# Patient Record
Sex: Male | Born: 1960
Health system: Southern US, Community
[De-identification: ages and names within clinical notes are randomized; demographics above are authoritative.]

## PROBLEM LIST (undated history)

## (undated) DIAGNOSIS — E119 Type 2 diabetes mellitus without complications: Secondary | ICD-10-CM

## (undated) HISTORY — DX: Type 2 diabetes mellitus without complications: E11.9

## (undated) HISTORY — PX: SPINE SURGERY: SHX786

---

## 1997-09-18 ENCOUNTER — Emergency Department (HOSPITAL_COMMUNITY): Admission: EM | Admit: 1997-09-18 | Discharge: 1997-09-18 | Payer: Self-pay | Admitting: *Deleted

## 2001-12-10 ENCOUNTER — Emergency Department (HOSPITAL_COMMUNITY): Admission: EM | Admit: 2001-12-10 | Discharge: 2001-12-10 | Payer: Self-pay | Admitting: Emergency Medicine

## 2001-12-10 ENCOUNTER — Encounter: Payer: Self-pay | Admitting: Emergency Medicine

## 2006-01-03 ENCOUNTER — Encounter: Admission: RE | Admit: 2006-01-03 | Discharge: 2006-01-03 | Payer: Self-pay | Admitting: Sports Medicine

## 2006-06-05 ENCOUNTER — Ambulatory Visit (HOSPITAL_BASED_OUTPATIENT_CLINIC_OR_DEPARTMENT_OTHER): Admission: RE | Admit: 2006-06-05 | Discharge: 2006-06-05 | Payer: Self-pay | Admitting: Orthopaedic Surgery

## 2006-09-06 ENCOUNTER — Encounter: Admission: RE | Admit: 2006-09-06 | Discharge: 2006-09-06 | Payer: Self-pay | Admitting: Orthopaedic Surgery

## 2006-10-26 ENCOUNTER — Inpatient Hospital Stay (HOSPITAL_COMMUNITY): Admission: RE | Admit: 2006-10-26 | Discharge: 2006-10-29 | Payer: Self-pay | Admitting: Orthopaedic Surgery

## 2010-08-10 NOTE — Op Note (Signed)
NAMEJahvier, Aldea [facility]                    ACCOUNT NO.:  [id number]   MEDICAL RECORD NO.:  [id number]          PATIENT TYPE:  INP   LOCATION:  5531                         FACILITY:  [facility]   PHYSICIAN:  Sharolyn Douglas, M.D.        DATE OF BIRTH:  01-08-1961   DATE OF PROCEDURE:  10/26/2006  DATE OF DISCHARGE:                               OPERATIVE REPORT   DIAGNOSIS:  1. Lumbar degenerative disk disease.  2. L5-S1 lumbar spondylosis with back and bilateral lower extremity      pain, right greater than left.   PROCEDURE:  1. L5-S1 bilateral hemilaminotomy for decompression of the L5-S1 nerve      roots bilaterally.  2. L5-S1 transforaminal lumbar interbody fusion with placement of two      PEEK cages within the disk space.  3. Pedicle screw instrumentation L5-S1 using Abbott spine system.  4. Posterior spinal fusion L5-S1.  5. Local autogenous bone graft supplemented with 5 mL Grafton      allograft and OP1 BMP.   SURGEON:  Sharolyn Douglas, MD.   ASSISTANT:  Aura Fey. Bobbe Medico.   ANESTHESIA:  General endotracheal.   ESTIMATED BLOOD LOSS:  200 mL.   COMPLICATIONS:  None.   NEEDLE AND SPONGE COUNT:  Correct.   INDICATIONS:  The patient is a pleasant 50 year old male with chronic  persistent back and bilateral lower extremity pain. Imaging studies show  degenerative disk disease at L5-S1 with disk desiccation and bulging of  disk.  In addition, there is narrowing of the lateral recess and also  severe hypertrophy of the L5-S1 facette joints due to spondylosis.  He  has failed all attempts other conservative modalities and now elects to  undergo L5-S1 decompression fusion in hopes of improving his symptoms.  Risks, benefits, alternatives reviewed.  The patient elects to proceed.   PROCEDURE:  After informed consent he was taken to the operating room.  He underwent general endotracheal anesthesia without difficulty, given  prophylactic IV antibiotics.  Neuro monitoring was established in  form  of lower extremity SSEPs and EMGs.  He was carefully turned prone on the  Wilson frame.  All bony prominences padded.  Face eyes protected all  times.  Back prepped draped in the usual sterile fashion.  Midline  incision made from L4 down to S1.  Dissection was carried sharply  through the deep fascia.  Subperiosteal exposure was completed, exposing  the tips the transverse processes of L5 and also the sacral ala  bilaterally.  This was quite difficult due to the huge enlargement of  the L5-S1 facette joints right greater than left.  The joints were  debrided using the Leksell rongeur to allow for further exposure.  Intraoperative x-ray was taken which confirmed the levels.  At this  point we elected to proceed with placement of the pedicle screws at L5-  S1 using anatomic probing technique.  Each pedicle point was started  with the awl.  The pedicle probe was then used to cannulate the  pedicles.  The pedicles were palpated.  There  were no breeches.  They  were then tapped and once again palpated without any evidence of breech.  We placed 6.5 x 45 mm screws at L5 and 7.5 x 30 mm screws in the sacrum.  The bone quality was good and the screw purchase was excellent.  We  stimulated each pedicle screw using triggered EMGs and there were no  deleterious changes.  It should be noted that before placing the pedicle  screws.  We had decorticated the transverse processes in preparation for  the arthrodesis.   We now moved forward with the hemilaminotomies at L5-S1 bilaterally.  High-speed bur along with the Leksell rongeur was used to remove the  inferior two thirds of the lamina bilaterally.  The ligamentum flavum  was then removed piecemeal.  We then identified the transversing S1  nerve root and decompressed it in the lateral recess.  We also completed  foraminotomies bilaterally decompressing L5 nerve root.  Once we were  satisfied with the decompression, we elected to proceed with  a  transforaminal lumbar interbody fusion in order to improve the fusion  rate and also to protect the pedicle screws from failure.  On the left  side the remaining facette joint was osteotomized.  The disk space was  entered through the transforaminal window that was created.  The exiting  transversing nerve roots were identified and protected at all times.  Free running EMGs were monitored.  There were no deleterious changes.  We then used disk spreaders to dilate the disk space up to 14 mm.  The  disk endplates were curetted.  Once we were satisfied with the clean-out  of the disk space, it was packed with a combination of Grafton allograft  and OP1 BMP.  We then placed this same mixture into a 14 x 26-mm PEEK  cage.  The cage was inserted into the interspace tamped anteriorly and  flipped.  We then placed a second 14 x 26-mm PEEK cage again packed with  the BMP Grafton mixture into the interspace posterior to the first cage.  We then took an intraoperative x-ray and there was adequate positioning  of the instrumentation.  We took a second x-ray to confirm positioning  of the L5 pedicle screws because the first image was slightly oblique.  The wound was irrigated.  50 mm titanium rods were placed into the  polyaxial screw heads and compression was applied before shearing off  the locking caps..  Hemostasis was achieved.  Gelfoam left over the  exposed epidural space.  Deep Hemovac drain left, deep fascia closed  with a running #1 Vicryl suture.  Subcutaneous layer closed with 2-0  Vicryl suture followed by a running 3-0 subcuticular Vicryl suture on  the skin edges.  Dermabond was applied.  Sterile dressing placed.  The  patient was turned supine, extubated without difficulty and transferred  to recovery stable condition.   It should be noted my assistant Fannie Knee C. Ireton, P.A. was present  throughout the procedure including positioning, she assisted with the  exposure using the Cobb  elevator and suction.  She then assisted me  using loupes and headlight magnification to perform the laminectomy, the  decompression and fusion.  She then assisted with wound closure.      Sharolyn Douglas, M.D.  Electronically Signed     MC/MEDQ  D:  10/26/2006  T:  10/27/2006  Job:  [id number]

## 2010-08-13 NOTE — Op Note (Signed)
Aaron Hill, Aaron Hill                    ACCOUNT NO.:  [id number]   MEDICAL RECORD NO.:  [id number]          PATIENT TYPE:  AMB   LOCATION:  DSC                          FACILITY:  [facility]   PHYSICIAN:  Sharolyn Douglas, M.D.        DATE OF BIRTH:  [birth year]   DATE OF PROCEDURE:  06/05/2006  DATE OF DISCHARGE:                               OPERATIVE REPORT   DIAGNOSIS:  Lumbar spondylosis and degenerative disc disease.   PROCEDURES:  1. Bilateral L5-S1 facet joint injections.  2. Right L5-S1 transforaminal epidural steroid injection.  3. Fluoroscopic imaging used for needle placement of the above      injection.   SURGEON:  Sharolyn Douglas, M.D.   ASSISTANT:  None.   ANESTHESIA:  MAC plus local.   COMPLICATIONS:  None.   INDICATIONS:  The patient is a 50 year old male with persistent back and  right lower extremity pain thought to be secondary to lumbar spondylosis  and foraminal narrowing.  He now presents for bilateral L5-S1 facet  joint injections and right L5-S1 transforaminal injections for further  diagnostic and potentially therapeutic purposes.  The risks, benefits  and alternatives reviewed.   PROCEDURE:  After informed consent, he was taken to the operating room.  He was turned prone.  He underwent sedation by anesthesia.  The back was  prepped and draped in the usual sterile fashion.  Fluoroscopy was  brought into the field.  The L5-S1 facet joints were visualized.  A 22  gauge Kwinkie spinal needles were advanced into the facet joints  bilaterally, aspiration showed no blood.  Then 40 mg of Depo-Medrol and  1 mL of 1% preservative free Lidocaine was injected into each facet  joint bilaterally.  We then turned our attention to performing a right  transforaminal epidural steroid injection.  Oblique imaging was  utilized.  A 22 gauge Kwinkie spinal needle was advanced underneath the  right L5 pedicle.  Aspiration showed no blood and no CSF.  A small  amount of Omnipaque injected  which showed some spread along the epidural  space and medial to the pedicle.  We then injected a solution of 80 mg  of Depo-Medrol 3 mL of 1% preservative free Lidocaine.  The patient  tolerated the procedure well and he was transferred to recovery in  stable condition, neurologically intact.  I reviewed post-injection  instructions with him through his translator.  He will follow up in two  weeks.      Sharolyn Douglas, M.D.  Electronically Signed    MC/MEDQ  D:  06/05/2006  T:  06/05/2006  Job:  [id number]

## 2010-08-13 NOTE — Discharge Summary (Signed)
[name], Hill [facility]                    ACCOUNT NO.:  [id number]   MEDICAL RECORD NO.:  [id number]          PATIENT TYPE:  INP   LOCATION:  5531                         FACILITY:  [facility]   PHYSICIAN:  Sharolyn Douglas, M.D.        DATE OF BIRTH:  [birth year]   DATE OF ADMISSION:  10/26/2006  DATE OF DISCHARGE:  10/29/2006                               DISCHARGE SUMMARY   ADMISSION DIAGNOSES:  1. Lumbar degenerative disk disease.  2. Lumbar spondylosis.   SECONDARY DIAGNOSIS:  Tobacco abuse.   OPERATIONS AND PROCEDURES:  1. Posterior spinal effusion with pedicle screws and T-lift at LP-1.  2. Lumbar decompression L5-S1.   BRIEF HISTORY:  The patient is a 50 year old male who has lumbar  degenerative disk disease unrelieved with conservative care with  significant pain and discomfort and elected to proceed with surgical  intervention.   HOSPITAL COURSE:  The patient underwent posterior spine effusion using  pedicle screws and T-lift, lumbar decompression L5-S1 on October 26, 2006.  He tolerated this procedure well using general anesthesia.  Stabilized  in the recovery room.  Placed on floor.  Poor pain control, IV  antibiotics, and care management discharge planning.  Postoperatively,  the patient was alert to voice.  Vital signs were stable.  Afebrile.  Started on vancomycin x24 hours.  Discontinue morphine pump.  Vicodin  and Robaxin for pain control.  TED hose and compression devices for  prophylaxis for DVT.  LSO for ambulation.  Physical therapy and  occupation therapy for progressive ambulation.  Care management and  discharge planning to advance his diet slowly.  First day  postoperatively, the patient was stable.  Vital signs were stable.  No  focal deficits.  Calves are soft.  Negative Homan's.  Wound benign.  No  erythema, drainage or sign of infection.  Started with physical therapy,  get out of bed to a chair and progressive ambulation.  Second day  postoperatively, the patient was  doing better, having better pain  control.  Weaned off the PCA morphine pump to p.o. pain med's.  The  Foley catheter was discontinued.  Hemovac was discontinued.  Dressing  was changed.  Incentive spirometry was encouraged.  He continued  physical therapy to get out of bed twice a day.  He is ready for  discharge home third day postoperatively, eating well, voiding well,  ambulating safely, wound benign, no erythema, drainage or signs of  infection.   PERTINENT LABORATORY FINDINGS:  Hemoglobin 11.8, hematocrit 34.7  postoperatively.  Sodium 135, potassium 3.9.  All other chemistries can  be obtained from the permanent hospital record.   DISCHARGE INSTRUCTIONS:  Discharge home October 29, 2006.  The patient may  shower.  IV and IV medications were discontinued by discharge.  Follow  up with Dr. Noel Hill in two weeks.  No lifting greater than 5  pounds for 12 weeks.  No driving for 2 weeks.  No repetitive bending,  sitting or squatting.  LSO for safe ambulation.  Walker for safe  ambulation.  Discharge instructions were indicated through  a friend who  was acting as an Equities trader.  Contact our office prior to followup with  any questions or concerns; [phone].      Aaron Hill.      Sharolyn Douglas, M.D.     SCI/MEDQ  D:  12/28/2006  T:  12/28/2006  Job:  [id number]

## 2011-01-10 LAB — COMPREHENSIVE METABOLIC PANEL
ALT: 103 — ABNORMAL HIGH
AST: 49 — ABNORMAL HIGH
Albumin: 3.9
Alkaline Phosphatase: 53
BUN: 14
CO2: 28
Calcium: 9.3
Chloride: 102
Creatinine, Ser: 0.82
GFR calc Af Amer: >60
GFR calc non Af Amer: >60
Glucose, Bld: 92
Potassium: 3.9
Sodium: 135
Total Bilirubin: 0.7
Total Protein: 7.6

## 2011-01-10 LAB — URINALYSIS, ROUTINE W REFLEX MICROSCOPIC
Bilirubin Urine: NEGATIVE
Glucose, UA: NEGATIVE
Hgb urine dipstick: NEGATIVE
Ketones, ur: NEGATIVE
Nitrite: NEGATIVE
Protein, ur: NEGATIVE
Specific Gravity, Urine: 1.012
Urobilinogen, UA: 0.2
pH: 6

## 2011-01-10 LAB — BASIC METABOLIC PANEL
BUN: 9
BUN: 9
CO2: 27
CO2: 29
Calcium: 8.2 — ABNORMAL LOW
Calcium: 8.7
Chloride: 101
Chloride: 101
Creatinine, Ser: 0.88
Creatinine, Ser: 0.94
GFR calc Af Amer: >60
GFR calc Af Amer: >60
GFR calc non Af Amer: >60
GFR calc non Af Amer: >60
Glucose, Bld: 120 — ABNORMAL HIGH
Glucose, Bld: 171 — ABNORMAL HIGH
Potassium: 3.5
Potassium: 3.6
Sodium: 134 — ABNORMAL LOW
Sodium: 135

## 2011-01-10 LAB — CBC
HCT: 47.7
Hemoglobin: 15.9
MCHC: 33.3
MCV: 95.2
Platelets: 218
RBC: 5.01
RDW: 12.5
WBC: 5.9

## 2011-01-10 LAB — URINE CULTURE
Colony Count: NO GROWTH
Culture: NO GROWTH

## 2011-01-10 LAB — DIFFERENTIAL
Basophils Absolute: 0
Basophils Relative: 0
Eosinophils Absolute: 0.2
Eosinophils Relative: 4
Lymphocytes Relative: 38
Lymphs Abs: 2.3
Monocytes Absolute: 0.4
Monocytes Relative: 6
Neutro Abs: 3.1
Neutrophils Relative %: 52

## 2011-01-10 LAB — APTT: aPTT: 33

## 2011-01-10 LAB — HEMOGLOBIN AND HEMATOCRIT, BLOOD
HCT: 34.7 — ABNORMAL LOW
HCT: 36.4 — ABNORMAL LOW
Hemoglobin: 11.8 — ABNORMAL LOW
Hemoglobin: 12.4 — ABNORMAL LOW

## 2011-01-10 LAB — TYPE AND SCREEN
ABO/RH(D): A POS
Antibody Screen: NEGATIVE

## 2011-01-10 LAB — ABO/RH: ABO/RH(D): A POS

## 2011-01-10 LAB — PROTIME-INR
INR: 0.9
Prothrombin Time: 12.6

## 2013-06-05 DIAGNOSIS — G8929 Other chronic pain: Secondary | ICD-10-CM | POA: Insufficient documentation

## 2016-12-28 ENCOUNTER — Ambulatory Visit (INDEPENDENT_AMBULATORY_CARE_PROVIDER_SITE_OTHER): Payer: Medicare HMO | Admitting: Physician Assistant

## 2016-12-28 ENCOUNTER — Encounter: Payer: Self-pay | Admitting: Physician Assistant

## 2016-12-28 VITALS — BP 120/84 | HR 74 | Temp 98.6°F | Resp 16 | Ht 66.0 in | Wt 175.4 lb

## 2016-12-28 DIAGNOSIS — S46812A Strain of other muscles, fascia and tendons at shoulder and upper arm level, left arm, initial encounter: Secondary | ICD-10-CM | POA: Diagnosis not present

## 2016-12-28 MED ORDER — CYCLOBENZAPRINE HCL 10 MG PO TABS: 5.0000 mg | ORAL_TABLET | Freq: Three times a day (TID) | ORAL | 0 refills | Status: DC | PRN

## 2016-12-28 NOTE — Patient Instructions (Addendum)
Please take the flexeril and apply ice the the shoulder and neck at this time.  I would like you to do these stretches three times per day, ice directly after stretches.   If you do not have any improvement in 1 week, please return.   Do not operate any heavy machinery while taking the medication.    Ask your health care provider which exercises are safe for you. Do exercises exactly as told by your health care provider and adjust them as directed. It is normal to feel mild stretching, pulling, tightness, or discomfort as you do these exercises, but you should stop right away if you feel sudden pain or your pain gets worse. Do not begin these exercises until told by your health care provider. Stretching and range of motion exercises These exercises warm up your muscles and joints and improve the movement and flexibility of your shoulder. These exercises also help to relieve pain, numbness, and tingling. Exercise A: Pendulum  1. Stand near a wall or a surface that you can hold onto for balance. 2. Bend at the waist and let your left / right arm hang straight down. Use your other arm to keep your balance. 3. Relax your arm and shoulder muscles, and move your hips and your trunk so your left / right arm swings freely. Your arm should swing because of the motion of your body, not because you are using your arm or shoulder muscles. 4. Keep moving so your arm swings in the following directions, as told by your health care provider: ? Side to side. ? Forward and backward. ? In clockwise and counterclockwise circles. Repeat __________ times, or for __________ seconds per direction. Complete this exercise __________ times a day. Exercise B: Flexion, standing  1. Stand and hold a broomstick, a cane, or a similar object with your hands. Place your hands a little more than shoulder-width apart on the object. Your left / right hand should be palm-up, and your other hand should be palm-down. 2. Push the stick  down with your healthy arm to raise your left / right arm in front of your body, and then over your head. Use your other hand to help move the stick. Stop when you feel a stretch in your shoulder, or when you reach the angle that is recommended by your health care provider. ? Avoid shrugging your shoulder while you raise your arm. Keep your shoulder blade tucked down toward your spine. 3. Hold for __________ seconds. 4. Slowly return to the starting position. Repeat __________ times. Complete this exercise __________ times a day. Exercise C: Flexion, seated  1. Sit in a stable chair so your left / right forearm can rest on a flat surface. Your elbow should rest at a height that keeps your upper arm next to your body. 2. Keeping your shoulder relaxed, lean forward at the waist and let your hand slide forward. Stop when you feel a stretch in your shoulder, or when you reach the angle that is recommended by your health care provider. 3. Hold for __________ seconds. 4. Slowly return to the starting position. Repeat __________ times. Complete this exercise __________ times a day. Strengthening exercises These exercises build strength and endurance in your shoulder. Endurance is the ability to use your muscles for a long time, even after they get tired. Exercise D: Shoulder abduction, isometric  1. Stand or sit about 4-6 inches (10-15 cm) away from a wall with your right/left side facing the wall. 2. [place]  your left / right elbow and gently press your elbow against the wall as if you are trying to move your arm out to your side. Gradually increase the pressure until you are pressing as hard as you can without shrugging your shoulder. 3. Hold for __________ seconds. 4. Slowly release the tension and relax your muscles completely before you repeat the exercise. Repeat __________ times. Complete this exercise __________ times a day. Exercise E: Internal rotation, isometric  1. Stand or sit in a doorway,  facing the door frame. 2. Bend your left / right elbow and place the palm of your hand against the door frame. Only your palm should be touching the frame. Keep your upper arm at your side. 3. Gently press your hand against the door frame, as if you are trying to push your arm toward your abdomen. ? Avoid shrugging your shoulder while you press your hand into the door frame. Keep your shoulder blade tucked down toward the middle of your back. 4. Hold for __________ seconds. 5. Slowly release the tension, and relax your muscles completely before you repeat the exercise. Repeat __________ times. Complete this exercise __________ times a day. Exercise F: External rotation, isometric  1. Stand or sit in a doorway, facing the door frame. 2. Bend your left / right elbow and place the back of your wrist against the door frame. Only the back of your wrist should be touching the frame. Keep your upper arm at your side. 3. Gently press your wrist against the door frame, as if you are trying to push your arm away from your abdomen. ? Avoid shrugging your shoulder while you press your wrist into the door frame. Keep your shoulder blade tucked down toward the middle of your back. 4. Hold for __________ seconds. 5. Slowly release the tension, and relax your muscles completely before you repeat the exercise. Repeat __________ times. Complete this exercise __________ times a day. Exercise G: Internal rotation  1. Sit in a stable chair without armrests, or stand. 2. Secure an exercise band at elbow height at your left / right side. 3. Place a soft object, such as a folded towel or a small pillow, between your left / right upper arm and your body to move your elbow a few inches (about 10 cm) away from your side. 4. Hold the end of the exercise band so it stretches. 5. Keeping your elbow pressed against the soft object, move your forearm in, toward your abdomen. Keep your body steady so the movement is only  coming from your shoulder. 6. Hold for __________ seconds. 7. Slowly return to the starting position. Repeat __________ times. Complete this exercise __________ times a day. Exercise H: External rotation  1. Sit in a stable chair without armrests, or stand. 2. Secure an exercise band at elbow height on your left / right side. 3. Place a soft object, such as a folded towel or a small pillow, between your left / right upper arm and your body to move your elbow a few inches (about 10 cm) away from your side. 4. Hold the end of the exercise band so it stretches. 5. Keeping your elbow pressed against the soft object, move your forearm out, away from your abdomen. Keep your body steady so the movement is only coming from your shoulder. 6. Hold for __________ seconds. 7. Slowly return to the starting position. Repeat __________ times. Complete this exercise __________ times a day. This information is not intended to replace advice  given to you by your health care provider. Make sure you discuss any questions you have with your health care provider. Document Released: 10/13/2004 Document Revised: 11/19/2015 Document Reviewed: 03/28/2015 Elsevier Interactive Patient Education  2018 [employer]  Cervical Strain and Sprain Rehab Ask your health care provider which exercises are safe for you. Do exercises exactly as told by your health care provider and adjust them as directed. It is normal to feel mild stretching, pulling, tightness, or discomfort as you do these exercises, but you should stop right away if you feel sudden pain or your pain gets worse.Do not begin these exercises until told by your health care provider. Stretching and range of motion exercises These exercises warm up your muscles and joints and improve the movement and flexibility of your neck. These exercises also help to relieve pain, numbness, and tingling. Exercise A: Cervical side bend  1. Using good posture, sit on a stable  chair or stand up. 2. Without moving your shoulders, slowly tilt your left / right ear to your shoulder until you feel a stretch in your neck muscles. You should be looking straight ahead. 3. Hold for __________ seconds. 4. Repeat with the other side of your neck. Repeat __________ times. Complete this exercise __________ times a day. Exercise B: Cervical rotation  1. Using good posture, sit on a stable chair or stand up. 2. Slowly turn your head to the side as if you are looking over your left / right shoulder. ? Keep your eyes level with the ground. ? Stop when you feel a stretch along the side and the back of your neck. 3. Hold for __________ seconds. 4. Repeat this by turning to your other side. Repeat __________ times. Complete this exercise __________ times a day. Exercise C: Thoracic extension and pectoral stretch 1. Roll a towel or a small blanket so it is about 4 inches (10 cm) in diameter. 2. Lie down on your back on a firm surface. 3. Put the towel lengthwise, under your spine in the middle of your back. It should not be not under your shoulder blades. The towel should line up with your spine from your middle back to your lower back. 4. Put your hands behind your head and let your elbows fall out to your sides. 5. Hold for __________ seconds. Repeat __________ times. Complete this exercise __________ times a day. Strengthening exercises These exercises build strength and endurance in your neck. Endurance is the ability to use your muscles for a long time, even after your muscles get tired. Exercise D: Upper cervical flexion, isometric 1. Lie on your back with a thin pillow behind your head and a small rolled-up towel under your neck. 2. Gently tuck your chin toward your chest and nod your head down to look toward your feet. Do not lift your head off the pillow. 3. Hold for __________ seconds. 4. Release the tension slowly. Relax your neck muscles completely before you repeat  this exercise. Repeat __________ times. Complete this exercise __________ times a day. Exercise E: Cervical extension, isometric  1. Stand about 6 inches (15 cm) away from a wall, with your back facing the wall. 2. Place a soft object, about 6-8 inches (15-20 cm) in diameter, between the back of your head and the wall. A soft object could be a small pillow, a ball, or a folded towel. 3. Gently tilt your head back and press into the soft object. Keep your jaw and forehead relaxed. 4. Hold for __________  seconds. 5. Release the tension slowly. Relax your neck muscles completely before you repeat this exercise. Repeat __________ times. Complete this exercise __________ times a day. Posture and body mechanics  Body mechanics refers to the movements and positions of your body while you do your daily activities. Posture is part of body mechanics. Good posture and healthy body mechanics can help to relieve stress in your body's tissues and joints. Good posture means that your spine is in its natural S-curve position (your spine is neutral), your shoulders are pulled back slightly, and your head is not tipped forward. The following are general guidelines for applying improved posture and body mechanics to your everyday activities. Standing  When standing, keep your spine neutral and keep your feet about hip-width apart. Keep a slight bend in your knees. Your ears, shoulders, and hips should line up.  When you do a task in which you stand in one place for a long time, place one foot up on a stable object that is 2-4 inches (5-10 cm) high, such as a footstool. This helps keep your spine neutral. Sitting   When sitting, keep your spine neutral and your keep feet flat on the floor. Use a footrest, if necessary, and keep your thighs parallel to the floor. Avoid rounding your shoulders, and avoid tilting your head forward.  When working at a desk or a computer, keep your desk at a height where your hands  are slightly lower than your elbows. Slide your chair under your desk so you are close enough to maintain good posture.  When working at a computer, place your monitor at a height where you are looking straight ahead and you do not have to tilt your head forward or downward to look at the screen. Resting When lying down and resting, avoid positions that are most painful for you. Try to support your neck in a neutral position. You can use a contour pillow or a small rolled-up towel. Your pillow should support your neck but not push on it. This information is not intended to replace advice given to you by your health care provider. Make sure you discuss any questions you have with your health care provider. Document Released: 03/14/2005 Document Revised: 11/19/2015 Document Reviewed: 02/18/2015 Elsevier Interactive Patient Education  [employer].

## 2016-12-30 ENCOUNTER — Telehealth: Payer: Self-pay | Admitting: Physician Assistant

## 2016-12-30 NOTE — Telephone Encounter (Signed)
Pt doesn't have the cyclobenzaprine (FLEXERIL) 10 MG tablet [[id number] script that we gave to him. He would like PCP to send this script to [facility]. Please advise at [phone]

## 2017-01-01 NOTE — Progress Notes (Signed)
PRIMARY CARE AT [facility] [address] [place] [address] [phone]  Date:  12/28/2016   Name:  Aaron Hill   DOB:  1960/09/16   MRN:  [id number]  PCP:  No primary care provider on file.    History of Present Illness:  Aaron Hill is a 56 y.o. male patient who presents to PCP with  Chief Complaint  Patient presents with  . Arm Pain    left head,shoulder,  pain x 2 weeks      Patient has had 2 weeks of neck and shoulder pain that continues to stay there.  He has no numbness or tingling but feels it down the left shoulder.  He has noticed no swelling.  Currently not working.  He reports no trauma or injury.  He has not slept anywhere new.  He reports that he has some pain in lower left knee.  Endorses pulling sensation in left eye.  No ear pain.  No mouth weakness, drool or slurred speech.   History of low lumbar pain with surgery.    There are no active problems to display for this patient.   History reviewed. No pertinent past medical history.  Past Surgical History:  Procedure Laterality Date  . SPINE SURGERY      Social History  Substance Use Topics  . Smoking status: Never Smoker  . Smokeless tobacco: Never Used  . Alcohol use No    History reviewed. No pertinent family history.  Allergies no known allergies  Medication list has been reviewed and updated.  No current outpatient prescriptions on file prior to visit.   No current facility-administered medications on file prior to visit.     ROS ROS otherwise unremarkable unless listed above.  Physical Examination: BP 120/84   Pulse 74   Temp 98.6 F (37 C)   Resp 16   Ht  (1.676 m)   Wt 175 lb 6.4 oz (79.6 kg)   SpO2 97%   BMI 28.31 kg/m  Ideal Body Weight: Weight in (lb) to have BMI = 25: 154.6  Physical Exam  Constitutional: He is oriented to person, place, and time. He appears well-developed and well-nourished. No distress.  HENT:  Head: Normocephalic and atraumatic.  Eyes: Pupils are  equal, round, and reactive to light. Conjunctivae, EOM and lids are normal.  Neck: Muscular tenderness (and trapezius musculature) present. No spinous process tenderness present. Normal range of motion present.  Cardiovascular: Normal rate.   Pulmonary/Chest: Effort normal. No respiratory distress.  Neurological: He is alert and oriented to person, place, and time. He has normal strength. No cranial nerve deficit.  Skin: Skin is warm and dry. He is not diaphoretic.  Psychiatric: He has a normal mood and affect. His behavior is normal.     Assessment and Plan: Aaron Hill is a 56 y.o. male who is here today for cc of  Chief Complaint  Patient presents with  . Arm Pain    left head,shoulder,  pain x 2 weeks    Not a great historian.  tia or cva unlikely after full assessment.  This is possible strain or spasm of the neck/shoulder Given stretches for these and ice directed. Precaution advised for muscle relaxer 2 week return if sxs do not improve.  1. Strain of left trapezius muscle, initial encounter - cyclobenzaprine (FLEXERIL) 10 MG tablet; Take 0.5-1 tablets (5-10 mg total) by mouth 3 (three) times daily as needed.  Dispense: 30 tablet; Refill: 0  Trena Platt, PA-C [facility] 01/01/2017 10:46 AM

## 2017-01-02 ENCOUNTER — Other Ambulatory Visit: Payer: Self-pay

## 2017-01-02 DIAGNOSIS — S46812A Strain of other muscles, fascia and tendons at shoulder and upper arm level, left arm, initial encounter: Secondary | ICD-10-CM

## 2017-01-02 MED ORDER — CYCLOBENZAPRINE HCL 10 MG PO TABS: 5.0000 mg | ORAL_TABLET | Freq: Three times a day (TID) | ORAL | 0 refills | Status: AC | PRN

## 2017-06-26 ENCOUNTER — Encounter: Payer: Self-pay | Admitting: Physician Assistant

## 2019-05-30 ENCOUNTER — Ambulatory Visit: Payer: Self-pay

## 2019-06-03 ENCOUNTER — Ambulatory Visit: Payer: Self-pay | Attending: Internal Medicine

## 2019-06-03 DIAGNOSIS — Z23 Encounter for immunization: Secondary | ICD-10-CM | POA: Insufficient documentation

## 2019-06-03 NOTE — Progress Notes (Signed)
   Covid-19 Vaccination Clinic  Name:  TAFT WORTHING    MRN: [id number] DOB: 1960/11/08  06/03/2019  Mr. Onnen was observed post Covid-19 immunization for 15 minutes without incident. He was provided with Vaccine Information Sheet and instruction to access the V-Safe system.   Mr. Deakins was instructed to call 911 with any severe reactions post vaccine: Marland Kitchen Difficulty breathing  . Swelling of face and throat  . A fast heartbeat  . A bad rash all over body  . Dizziness and weakness   Immunizations Administered    Name Date Dose VIS Date Route   Pfizer COVID-19 Vaccine 06/03/2019  1:09 PM 0.3 mL 03/08/2019 Intramuscular   Manufacturer: [employer], [employer]   Lot: [id number]   NDC: [address]-1

## 2019-07-03 ENCOUNTER — Ambulatory Visit: Payer: Self-pay | Attending: Internal Medicine

## 2019-07-03 DIAGNOSIS — Z23 Encounter for immunization: Secondary | ICD-10-CM

## 2019-07-03 NOTE — Progress Notes (Signed)
   Covid-19 Vaccination Clinic  Name:  Aaron Hill    MRN: [id number] DOB: October 14, 1960  07/03/2019  Aaron Hill was observed post Covid-19 immunization for 15 minutes without incident. He was provided with Vaccine Information Sheet and instruction to access the V-Safe system.   Aaron Hill was instructed to call 911 with any severe reactions post vaccine: Marland Kitchen Difficulty breathing  . Swelling of face and throat  . A fast heartbeat  . A bad rash all over body  . Dizziness and weakness   Immunizations Administered    Name Date Dose VIS Date Route   Pfizer COVID-19 Vaccine 07/03/2019 11:50 AM 0.03 mL 03/08/2019 Intramuscular   Manufacturer: [employer], [employer]   Lot: [id number]   NDC: [address]-1

## 2019-08-28 DIAGNOSIS — M67449 Ganglion, unspecified hand: Secondary | ICD-10-CM | POA: Insufficient documentation

## 2020-01-14 ENCOUNTER — Ambulatory Visit: Payer: Medicare HMO | Attending: Internal Medicine

## 2020-01-14 DIAGNOSIS — Z23 Encounter for immunization: Secondary | ICD-10-CM

## 2020-01-14 NOTE — Progress Notes (Signed)
   Covid-19 Vaccination Clinic  Name:  ZARIN KNUPP    MRN: [id number] DOB: 05-19-1960  01/14/2020  Mr. Velazco was observed post Covid-19 immunization for 15 minutes without incident. He was provided with Vaccine Information Sheet and instruction to access the V-Safe system.   Mr. Willcutt was instructed to call 911 with any severe reactions post vaccine: Marland Kitchen Difficulty breathing  . Swelling of face and throat  . A fast heartbeat  . A bad rash all over body  . Dizziness and weakness

## 2020-01-15 ENCOUNTER — Other Ambulatory Visit: Payer: Self-pay

## 2020-01-15 ENCOUNTER — Ambulatory Visit (INDEPENDENT_AMBULATORY_CARE_PROVIDER_SITE_OTHER): Payer: Medicare HMO | Admitting: Family Medicine

## 2020-01-15 ENCOUNTER — Encounter: Payer: Self-pay | Admitting: Family Medicine

## 2020-01-15 VITALS — BP 124/74 | HR 60 | Temp 98.1°F | Ht 66.0 in | Wt 168.0 lb

## 2020-01-15 DIAGNOSIS — G8929 Other chronic pain: Secondary | ICD-10-CM

## 2020-01-15 DIAGNOSIS — T560X1A Toxic effect of lead and its compounds, accidental (unintentional), initial encounter: Secondary | ICD-10-CM

## 2020-01-15 DIAGNOSIS — M549 Dorsalgia, unspecified: Secondary | ICD-10-CM

## 2020-01-15 DIAGNOSIS — Z0000 Encounter for general adult medical examination without abnormal findings: Secondary | ICD-10-CM

## 2020-01-15 DIAGNOSIS — K5903 Drug induced constipation: Secondary | ICD-10-CM | POA: Diagnosis not present

## 2020-01-15 DIAGNOSIS — Z1329 Encounter for screening for other suspected endocrine disorder: Secondary | ICD-10-CM

## 2020-01-15 DIAGNOSIS — Z131 Encounter for screening for diabetes mellitus: Secondary | ICD-10-CM

## 2020-01-15 DIAGNOSIS — M6740 Ganglion, unspecified site: Secondary | ICD-10-CM

## 2020-01-15 DIAGNOSIS — Z13220 Encounter for screening for lipoid disorders: Secondary | ICD-10-CM

## 2020-01-15 DIAGNOSIS — M1A1720 Lead-induced chronic gout, left ankle and foot, without tophus (tophi): Secondary | ICD-10-CM

## 2020-01-15 LAB — LIPID PANEL

## 2020-01-15 LAB — BASIC METABOLIC PANEL

## 2020-01-15 MED ORDER — HYDROCODONE-ACETAMINOPHEN 5-325 MG PO TABS: 1.0000 | ORAL_TABLET | Freq: Two times a day (BID) | ORAL | 0 refills | Status: AC

## 2020-01-15 MED ORDER — DOCUSATE SODIUM 100 MG PO CAPS: 100.0000 mg | ORAL_CAPSULE | Freq: Every day | ORAL | 3 refills | Status: AC

## 2020-01-15 NOTE — Progress Notes (Signed)
10/20/202111:08 AM  Aaron Hill 05-03-1960, 59 y.o., male [id number]  Chief Complaint  Patient presents with  . Back Pain    request refill for hydrocodone hx of back surgery    HPI:   Patient is a 59 y.o. male with past medical history significant for back surgery who presents today for back pain  Interpreter services was used for this appointment  Work accident 10 plus years ago Spinal surgery 2007-2010 unsure of year Per patient "Injected in spine to make bones stronger (plate and screws placed) Pain is permanent per doctor" On disability currently left side arm and leg tingling sensation since surgery Chronic Constipation Chronic Cyst left hand and left foot, concerned it may be gout Previously seen for this and diagnosed as ganglion cyst Ran out of pain meds 3 days ago, Takes Norco 5-325 2 tabs per day    Depression screen [facility] 2/9 01/15/2020 12/28/2016  Decreased Interest 0 0  Down, Depressed, Hopeless 0 0  PHQ - 2 Score 0 0    Fall Risk  01/15/2020 12/28/2016  Falls in the past year? 0 No  Number falls in past yr: 0 -  Injury with Fall? 0 -  Follow up Falls evaluation completed -     No Known Allergies  Prior to Admission medications   Medication Sig Start Date End Date Taking? Authorizing Provider  cyclobenzaprine (FLEXERIL) 10 MG tablet Take 0.5-1 tablets (5-10 mg total) by mouth 3 (three) times daily as needed. Patient not taking: Reported on 01/15/2020 01/02/17   Trena Platt D, PA    No past medical history on file.  Past Surgical History:  Procedure Laterality Date  . SPINE SURGERY      Social History   Tobacco Use  . Smoking status: Never Smoker  . Smokeless tobacco: Never Used  Substance Use Topics  . Alcohol use: No    No family history on file.  Review of Systems  Eyes: Negative for blurred vision and double vision.  Respiratory: Negative for cough, shortness of breath and wheezing.   Cardiovascular: Negative for chest pain,  palpitations and leg swelling.  Gastrointestinal: Positive for constipation. Negative for abdominal pain, blood in stool, diarrhea, heartburn, nausea and vomiting.  Genitourinary: Negative for dysuria, frequency and hematuria.  Musculoskeletal: Positive for back pain. Negative for falls, joint pain and neck pain.  Skin: Negative for rash.  Neurological: Positive for tingling. Negative for dizziness, weakness and headaches.  Psychiatric/Behavioral: Negative for suicidal ideas.     OBJECTIVE:  Today's Vitals   01/15/20 0913  BP: 124/74  Pulse: 60  Temp: 98.1 F (36.7 C)  SpO2: 99%  Weight: 168 lb (76.2 kg)  Height: 5\' 6"  (1.676 m)   Body mass index is 27.12 kg/m.   Physical Exam Vitals reviewed.  Constitutional:      Appearance: Normal appearance.  HENT:     Head: Normocephalic and atraumatic.  Eyes:     Conjunctiva/sclera: Conjunctivae normal.     Pupils: Pupils are equal, round, and reactive to light.  Cardiovascular:     Rate and Rhythm: Normal rate and regular rhythm.     Pulses: Normal pulses.     Heart sounds: Normal heart sounds. No murmur heard.  No friction rub. No gallop.   Pulmonary:     Effort: Pulmonary effort is normal. No respiratory distress.     Breath sounds: Normal breath sounds. No stridor. No wheezing or rales.  Abdominal:     General: Bowel sounds are  normal.     Palpations: Abdomen is soft.     Tenderness: There is no abdominal tenderness.  Musculoskeletal:     Right upper arm: Normal.     Left upper arm: Normal.     Right forearm: Normal.     Left forearm: Normal.     Cervical back: Normal.     Thoracic back: Normal.     Lumbar back: Normal.     Right lower leg: Normal. No edema.     Left lower leg: Normal. No edema.  Skin:    General: Skin is warm and dry.       Neurological:     General: No focal deficit present.     Mental Status: He is alert and oriented to person, place, and time.     Motor: Motor function is intact.      Coordination: Coordination is intact.  Psychiatric:        Mood and Affect: Mood normal.        Behavior: Behavior normal.     No results found for this or any previous visit (from the past 24 hour(s)).  No results found.  COVID: done Flu: Sept 2021 Colon Cancer screen: Per patient 3 years ago colonoscopy and was normal, unable to find records of this   ASSESSMENT and PLAN  Problem List Items Addressed This Visit    None    Visit Diagnoses    Chronic back pain, unspecified back location, unspecified back pain laterality    -  Primary   Relevant Medications   HYDROcodone-acetaminophen (NORCO) 5-325 MG tablet   Other Relevant Orders   Ambulatory referral to Pain Clinic Physical therapy in past, Refused now Chronic pain clinic: as option for pain control Discussed options for pain management, Prescription sent for 10 tabs of Norco, explained this was temporary and pain would need to be managed by chronic pain   Screening for lipid disorders       Relevant Orders   Lipid Panel   Screening for diabetes mellitus       Relevant Orders   Hemoglobin A1c   Screening for thyroid disorder       Relevant Orders   TSH   Annual physical exam       Relevant Orders   CBC   Basic Metabolic Panel   Ganglion cyst     Educated on options, drainage vs surgery Declined at this time   Screening for gout, initial encounter       Other Relevant Orders   Uric Acid   Drug-induced constipation       Relevant Medications   docusate sodium (COLACE) 100 MG capsule daily    Screening Colon Cancer screen: Per patient 3 years ago colonoscopy and was normal, unable to find records of this. May need to repeat  Will follow up with lab results.  Return in about 6 months (around 07/15/2020).   Macario Carls Avanti Jetter, FNP-BC Primary Care at [facility] [address] [place], [place] [address] Ph.  [phone] Fax [phone]

## 2020-01-15 NOTE — Patient Instructions (Addendum)
Will follow up with lab results Take stool softener daily with pain medications Chronic pain will contact you  ?au l?ng m?n tnh Chronic Back Pain Khi ?au l?ng ko di h?n 3 thng, n ???c g?i l ?au l?ng m?n tnh. Nguyn nhn ?au l?ng c?a qu v? c th? khng r. M?t s? nguyn nhn ph? bi?n bao g?m:  Mn v rch (b?nh thoi ha) c?a x??ng, dy ch?ng, ho?c ??a ??m ? l?ng.  Vim v c?ng x??ng ? l?ng (vim kh?p). Nh?ng ng??i b? ?au l?ng m?n tnh th??ng tr?i qua m?t s? giai ?o?n trong ? ?au d? d?i h?n (bng pht). Nhi?u ng??i c th? h?c cch qu?n l ?au b?ng ch?m [place] t?i nh. Tun th? nh?ng h??ng d?n ny ? nh: Hy ch  ??n b?t c? thay ??i no v? tri?u ch?ng c?a qu v?. Th?c hi?n nh?ng hnh ??ng sau ?? gip gi?m ?au: Ho?t ??ng   Trnh g?p ng??i v cc ho?t ??ng khc khi?n v?n ?? tr? nn t?i t? h?n.  Duy tr t? th? ph h?p khi ??ng ho?c ng?i: ? Khi ??ng, hy gi? cho ph?n l?ng trn v c? th?ng v?i vai ko v? pha sau. Trnh ?? vai thng xu?ng. ? Khi ng?i, hy gi? l?ng th?ng v ?? vai th? gin. Khng quay vai ho?c ko vai ra pha sau.  Khng ng?i ho?c ??ng m?t ch? trong th?i gian di.  Nghi? ng?i trong nh??ng khoa?ng th??i gian ng??n trong nga?y. Lm nh? v?y s? gi?m ?au. Nghi? ng?i ?? t? th? n??m ho??c ???ng th???ng t?t h?n la? ng?i nghi?Rolland Bimler quy? vi? nghi? ng?i trong th??i gian da?i, xen l?n va?i hoa?t ??ng nhe? nha?ng ho??c ke?o gin gi??a ca?c giai ?oa?n nghi?. Vi?c na?y se? giu?p tra?nh c??ng kh?p va? ?au.  T?p th? d?c th??ng xuyn. Hy h?i chuyn gia ch?m [place] s?c kh?e v? cc ho?t ??ng no l an ton cho qu v?.  Khng nng b?t k? v?t g n?ng h?n 10 lb (4,5 kg). Lun lun s? d?ng k? thu?t nng ph h?p, bao g?m: ? Cong ??u g?i. ? Gi?? cho v?t n??ng g?n c? th? quy? vi?. ? Tra?nh v??n ng???i.  Ng? trn n?m c?ng v?i t? th? tho?i mi. C? g?ng n?m nghing v?i ??u g?i h?i cong. N?u quy? vi? n??m ng??a, hy ??t m?t chi?c g?i d??i ??u g?i c?a quy? vi?. X? tr ?au  N?u ???c  ch? d?n, hy ch??m ? l?nh vo vng b? ?au. Chuyn gia ch?m [place] s?c kh?e c th? khuy?n ngh? ch??m ? l?nh trong 24-48 gi? ??u tin sau khi m?t ??t bng pht b?t ??u. ? Cho ? l?nh vo ti ni lng. ? ?? kh?n t?m ? gi?a da v ti ch??m. ? Ch??m ? l?nh trong 20 pht, 2-3 l?n m?i ngy.  N?u ???c chi? d?n, ha?y ch???m no?ng va?o vu?ng bi? ?au th??ng xuyn theo chi? d?n cu?a chuyn gia ch?m so?c s??c kho?e. S? d?ng ngu?n nhi?t m chuyn gia ch?m [place] s?c kh?e khuy?n ngh?, ch?ng h?n nh? ti ch??m nhi?t ?m ho?c mi?ng ??m ch??m nng. ? ?? kh?n t?m ? gi?a da v ngu?n nhi?t. ? Duy tr ngu?n nhi?t trong 20-30 pht. ? B? ngu?n nhi?t ra n?u da qu v? chuy?n sang mu ?? nh?t. ?i?u ny ??c bi?t quan tr?ng n?u qu v? khng th? c?m th?y ?au, nng, hay l?nh. Qu v? c th? c nguy c? b? b?ng cao h?n.  Th? ngm trong b?n t?m ?m.  Ch? s? d?ng  thu?c khng k ??n v thu?c k ??n theo ch? d?n c?a chuyn gia ch?m [place] s?c kh?e.  Tun th? t?t c? cc l?n khm theo di theo ch? d?n c?a chuyn gia ch?m [place] s?c kh?e. ?i?u ny c vai tr quan tr?ng. Hy lin l?c v?i chuyn gia ch?m [place] s?c kh?e n?u:  Qu v? b? ?au khng thuyn gi?m sau khi nghi? ng?i ho??c dng thu?c. Yu c?u tr? gip ngay l?p t?c n?u:  Qu v? b? y?u ho?c t ? m?t ho?c hai chn ho?c hai bn chn.  Qu v? b? kh ki?m sot vi?c ti?u ti?n ho?c ??i ti?n c?a mnh.  Qu v? b? bu?n nn ho?c nn m?a.  Qu v? b? ?au ? b?ng.  Qu v? kh th? ho?c ng?t x?u. Thng tin ny khng nh?m m?c ?ch thay th? cho l?i khuyn m chuyn gia ch?m [place] s?c kh?e ni v?i qu v?. Hy b?o ??m qu v? ph?i th?o lu?n b?t k? v?n ?? g m qu v? c v?i chuyn gia ch?m [place] s?c kh?e c?a qu v?. Document Revised: 04/03/2018 Document Reviewed: 04/03/2018 Elsevier Patient Education  [employer].   If you have lab work done today you will be contacted with your lab results within the next 2 weeks.  If you have not heard from [place] then please contact us. The fastest way to get your  results is to register for My Chart.   IF you received an x-ray today, you will receive an invoice from [facility]. Please contact [facility] at [phone] with questions or concerns regarding your invoice.   IF you received labwork today, you will receive an invoice from [place]. Please contact [facility] at [phone] with questions or concerns regarding your invoice.   Our billing staff will not be able to assist you with questions regarding bills from these companies.  You will be contacted with the lab results as soon as they are available. The fastest way to get your results is to activate your My Chart account. Instructions are located on the last page of this paperwork. If you have not heard from [place] regarding the results in 2 weeks, please contact this office.

## 2020-01-16 LAB — CBC
Hematocrit: 45.2 % (ref 37.5–51.0)
Hemoglobin: 15.3 g/dL (ref 13.0–17.7)
MCH: 31.4 pg (ref 26.6–33.0)
MCHC: 33.8 g/dL (ref 31.5–35.7)
MCV: 93 fL (ref 79–97)
Platelets: 190 10*3/uL (ref 150–450)
RBC: 4.88 x10E6/uL (ref 4.14–5.80)
RDW: 12.1 % (ref 11.6–15.4)
WBC: 5.5 10*3/uL (ref 3.4–10.8)

## 2020-01-16 LAB — BASIC METABOLIC PANEL
BUN/Creatinine Ratio: 20 (ref 10–24)
BUN: 20 mg/dL (ref 8–27)
CO2: 23 mmol/L (ref 20–29)
Calcium: 9.6 mg/dL (ref 8.6–10.2)
Chloride: 102 mmol/L (ref 96–106)
Creatinine, Ser: 1 mg/dL (ref 0.76–1.27)
GFR calc Af Amer: 93 mL/min/{1.73_m2} (ref >59)
Glucose: 111 mg/dL — ABNORMAL HIGH (ref 65–99)
Potassium: 4.6 mmol/L (ref 3.5–5.2)

## 2020-01-16 LAB — LIPID PANEL
HDL: 54 mg/dL (ref >39)
LDL Chol Calc (NIH): 152 mg/dL — ABNORMAL HIGH (ref 0–99)
Triglycerides: 74 mg/dL (ref 0–149)
VLDL Cholesterol Cal: 13 mg/dL (ref 5–40)

## 2020-01-16 LAB — TSH: TSH: 0.236 u[IU]/mL — ABNORMAL LOW (ref 0.450–4.500)

## 2020-01-16 LAB — URIC ACID: Uric Acid: 7.4 mg/dL (ref 3.8–8.4)

## 2020-01-16 LAB — HEMOGLOBIN A1C
Est. average glucose Bld gHb Est-mCnc: 120 mg/dL
Hgb A1c MFr Bld: 5.8 % — ABNORMAL HIGH (ref 4.8–5.6)

## 2020-01-21 ENCOUNTER — Ambulatory Visit (INDEPENDENT_AMBULATORY_CARE_PROVIDER_SITE_OTHER): Payer: Medicare HMO | Admitting: Family Medicine

## 2020-01-21 ENCOUNTER — Other Ambulatory Visit: Payer: Self-pay

## 2020-01-21 ENCOUNTER — Encounter: Payer: Self-pay | Admitting: Family Medicine

## 2020-01-21 VITALS — BP 136/85 | HR 66 | Temp 97.6°F | Ht 66.0 in | Wt 171.0 lb

## 2020-01-21 DIAGNOSIS — E7800 Pure hypercholesterolemia, unspecified: Secondary | ICD-10-CM | POA: Diagnosis not present

## 2020-01-21 DIAGNOSIS — Z6827 Body mass index (BMI) 27.0-27.9, adult: Secondary | ICD-10-CM | POA: Insufficient documentation

## 2020-01-21 DIAGNOSIS — M67449 Ganglion, unspecified hand: Secondary | ICD-10-CM

## 2020-01-21 DIAGNOSIS — E0590 Thyrotoxicosis, unspecified without thyrotoxic crisis or storm: Secondary | ICD-10-CM

## 2020-01-21 DIAGNOSIS — R7303 Prediabetes: Secondary | ICD-10-CM | POA: Insufficient documentation

## 2020-01-21 DIAGNOSIS — E785 Hyperlipidemia, unspecified: Secondary | ICD-10-CM

## 2020-01-21 DIAGNOSIS — K5903 Drug induced constipation: Secondary | ICD-10-CM

## 2020-01-21 DIAGNOSIS — Z1321 Encounter for screening for nutritional disorder: Secondary | ICD-10-CM

## 2020-01-21 DIAGNOSIS — Z0000 Encounter for general adult medical examination without abnormal findings: Secondary | ICD-10-CM

## 2020-01-21 DIAGNOSIS — K5900 Constipation, unspecified: Secondary | ICD-10-CM | POA: Insufficient documentation

## 2020-01-21 DIAGNOSIS — Z1159 Encounter for screening for other viral diseases: Secondary | ICD-10-CM

## 2020-01-21 MED ORDER — ATORVASTATIN CALCIUM 10 MG PO TABS: 10.0000 mg | ORAL_TABLET | Freq: Every day | ORAL | 3 refills | Status: AC

## 2020-01-21 NOTE — Patient Instructions (Addendum)
Please pick up Lipitor and take medication daily. Will follow up with lab results.  If you have lab work done today you will be contacted with your lab results within the next 2 weeks.  If you have not heard from [place] then please contact us. The fastest way to get your results is to register for My Chart.   IF you received an x-ray today, you will receive an invoice from [facility]. Please contact [facility] at [phone] with questions or concerns regarding your invoice.   IF you received labwork today, you will receive an invoice from [place]. Please contact [facility] at [phone] with questions or concerns regarding your invoice.   Our billing staff will not be able to assist you with questions regarding bills from these companies.  You will be contacted with the lab results as soon as they are available. The fastest way to get your results is to activate your My Chart account. Instructions are located on the last page of this paperwork. If you have not heard from [place] regarding the results in 2 weeks, please contact this office.      Nang ho?t d?ch Ganglion Cyst  Nang ho?t d?ch l m?t c?c u khng ph?i ung th?, ch?a ??y d?ch xu?t hi?n g?n kh?p ho?c g?n gn. Nang pht tri?n ngoi kh?p ho?c ngoi l?p b?c gn. Nang ho?t d?ch th??ng hay pht tri?n ? bn tay ho?c c? tay nh?t, nh?ng c?ng c th? pht tri?n ? vai, khu?u tay, hng, ??u g?i, m?t c chn, ho?c bn chn. Nang ho?t d?ch hnh qu? bng ho?c hnh qu? tr?ng. Kch th??c nang c th? thay ??i t? h?t ??u ??n l?n h?n tri nho. T?ng c??ng ho?t ??ng c th? khi?n nang l?n h?n v ch?t l?ng b?t ??u tch t? nhi?u h?n. Nguyn nhn g gy ra? Khng r nguyn nhn chnh xc gy ra tnh tr?ng ny, nh?ng n c th? lin quan ??n:  Vim ho?c kch thch xung quanh kh?p.  Ch?n th??ng.  Chuy?n ??ng l?p ?i l?p l?i ho?c s? d?ng qu m?c.  Vim kh?p. ?i?u g lm t?ng nguy c?? Qu v? d? b? tnh tr?ng ny h?n n?u:  Qu v? l ph?  n?.  Qu v? 15-40 tu?i. Cc d?u hi?u ho?c tri?u ch?ng l g? Tri?u ch?ng chnh c?a tnh tr?ng ny l m?t c?c. N th??ng xu?t hi?n nh?t ? bn tay ho?c c? tay. Trong nhi?u tr??ng h?p, khng c tri?u ch?ng no khc, nh?ng nang ?i khi c th? gy:  ?au nhi.  ?au.  T b.  Y?u c?.  N?m tay y?u.  Ph?m vi c? ??ng c?a kh?p t h?n. Ch?n ?on tnh tr?ng ny nh? th? no? Nang ho?t d?ch th??ng ???c ch?n ?on d?a trn khm th?c th?Shaune Pascal gia ch?m [place] s?c kh?e c?a qu v? s? s? th?y c?c v c th? chi?u ?n sng bn c?nh c?c ?. N?u l nang ho?t d?ch, nh sng c th? s? ?i xuyn qua nang. Chuyn gia ch?m [place] s?c kh?e c?a qu v? c th? yu c?u ch?p X-quang, siu m ho?c MRI ?? lo?i tr? cc b?nh khc. Tnh tr?ng ny ???c ?i?u tr? nh? th? no? Nang ho?t d?ch th??ng t? h?t m khng c?n ?i?u tr?. N?u b? ?au ho?c c cc tri?u ch?ng khc, qu v? c th? c?n ?i?u tr?. Th??ng c?n ?i?u tr? n?u nang ho?t d?ch h?n ch? c? ??ng c?a qu v? ho?c n?u nang b? nhi?m trng. ?i?u tr? c th? bao g?m:  ?  eo n?p ho?c vng vo c? tay ho?c ngn tay.  Dng thu?c ch?ng vim.  D?n l?u d?ch t? c?c ? b?ng kim tim (ht).  Tim steroid vo kh?p.  Ph?u thu?t ?? lo?i b? nang ho?t d?ch.  ??t mi?ng ??m ln giy ho?c ?i giy s? khng c? vo nang n?u nang n?m ? bn chn. Tun th? nh?ng h??ng d?n ny ? nh:  Khng ?n vo nang ho?t d?ch, ch?c kim vo nang, ho?c ??p vo nang.  Ch? s? d?ng thu?c khng k ??n v thu?c k ??n theo ch? d?n c?a chuyn gia ch?m [place] s?c kh?e.  N?u qu v? s? d?ng n?p ho?c dy ?eo: ? Hy ?eo theo ch? d?n c?a chuyn gia ch?m [place] s?c kh?e. ? Tho ra theo ch? d?n c?a chuyn gia ch?m [place] s?c kh?e. H?i xem qu v? c c?n tho ra khi t?m d??i vi sen ho?c t?m b?n khng.  Quan st xem nang ho?t d?ch c b?t c? thay ??i no khng.  Tun th? t?t c? cc l?n khm theo di theo ch? d?n c?a chuyn gia ch?m [place] s?c kh?e. ?i?u ny c vai tr quan tr?ng. Hy lin l?c v?i chuyn gia ch?m [place] s?c kh?e n?u:  Nang ho?t  d?ch tr? nn l?n h?n ho?c ?au h?n.  C m? ? nang ch?y ra.  Qu v? c c?m gic y?u ho?c t ? vng b? ?nh h??ng.  Qu v? b? s?t ho?c ?n l?nh. Yu c?u tr? gip ngay l?p t?c n?u:  Qu v? b? s?t v c b?t k? tri?u ch?ng no sau ?y ? vng c nang: ? T?ng t?y ??. ? Cc s?c ??. ? S?ng n?. Tm t?t  Nang ho?t d?ch l m?t c?c u khng ph?i ung th?, ch?a ??y d?ch xu?t hi?n g?n kh?p ho?c g?n gn.  Nang ho?t d?ch th??ng hay pht tri?n ? bn tay ho?c c? tay nh?t, nh?ng c?ng c th? pht tri?n ? vai, khu?u tay, hng, ??u g?i, m?t c chn, ho?c bn chn.  Nang ho?t d?ch th??ng t? h?t m khng c?n ?i?u tr?[place]ery [place] tin ny khng nh?m m?c ?ch thay th? cho l?i khuyn m chuyn gia ch?m [place] s?c kh?e ni v?i qu v?. Hy b?o ??m qu v? ph?i th?o lu?n b?t k? v?n ?? g m qu v? c v?i chuyn gia ch?m [place] s?c kh?e c?a qu v?. Document Revised: 03/01/2017 Document Reviewed: 03/01/2017 Elsevier Patient Education  2020 [employer].

## 2020-01-21 NOTE — Progress Notes (Signed)
10/26/20219:47 AM  Aaron Hill 1960-04-06, 59 y.o., male [id number]  Chief Complaint  Patient presents with  . Follow-up    lab results  . swelling in thumbs and toes    HPI:   Patient is a 59 y.o. male with past medical history significant for chronic back pain who presents today for lab follow up.  Interpreter services used for this visit.  Prediabetes Discussed ranges of A1C that mean Diabetes Educated on diet and exercise  Lab Results  Component Value Date   HGBA1C 5.8 (H) 01/15/2020   Hyperlipidemia Due to his lab results it is recommended he start a moderate intensity statin Discussed potential side effects  Lab Results  Component Value Date   CHOL 219 (H) 01/15/2020   HDL 54 01/15/2020   LDLCALC 152 (H) 01/15/2020   TRIG 74 01/15/2020   CHOLHDL 4.1 01/15/2020   BP Readings from Last 3 Encounters:  01/21/20 136/85  01/15/20 124/74  12/28/16 120/84    Hyperthyroid Denies all symptoms  Will follow up with thyroid panel Lab Results  Component Value Date   TSH 0.236 (L) 01/15/2020     Depression screen [facility] 2/9 01/21/2020 01/15/2020 12/28/2016  Decreased Interest 0 0 0  Down, Depressed, Hopeless 0 0 0  PHQ - 2 Score 0 0 0    Fall Risk  01/21/2020 01/15/2020 12/28/2016  Falls in the past year? 0 0 No  Number falls in past yr: 0 0 -  Injury with Fall? 0 0 -  Follow up Falls evaluation completed Falls evaluation completed -     No Known Allergies  Prior to Admission medications   Medication Sig Start Date End Date Taking? Authorizing Provider  HYDROcodone-acetaminophen (NORCO/VICODIN) 5-325 MG tablet Take 1 tablet by mouth every 6 (six) hours as needed for moderate pain.   Yes [provider]  cyclobenzaprine (FLEXERIL) 10 MG tablet Take 0.5-1 tablets (5-10 mg total) by mouth 3 (three) times daily as needed. Patient not taking: Reported on 01/15/2020 01/02/17   Trena Platt D, PA  docusate sodium (COLACE) 100 MG capsule Take 1 capsule  (100 mg total) by mouth daily. Patient not taking: Reported on 01/21/2020 01/15/20   Wileen Duncanson, Azalee Course, FNP    History reviewed. No pertinent past medical history.  Past Surgical History:  Procedure Laterality Date  . SPINE SURGERY      Social History   Tobacco Use  . Smoking status: Never Smoker  . Smokeless tobacco: Never Used  Substance Use Topics  . Alcohol use: No    History reviewed. No pertinent family history.  Review of Systems  Constitutional: Negative for chills, fever and malaise/fatigue.  Eyes: Negative for blurred vision and double vision.  Respiratory: Negative for cough, shortness of breath and wheezing.   Cardiovascular: Negative for chest pain, palpitations and leg swelling.  Gastrointestinal: Positive for constipation. Negative for abdominal pain, blood in stool, diarrhea, heartburn, nausea and vomiting.  Genitourinary: Negative for dysuria, frequency and hematuria.  Musculoskeletal: Negative for back pain and joint pain.  Skin: Negative for rash.  Neurological: Negative for dizziness, weakness and headaches.     OBJECTIVE:  Today's Vitals   01/21/20 0903  BP: 136/85  Pulse: 66  Temp: 97.6 F (36.4 C)  Weight: 171 lb (77.6 kg)  Height: 5\' 6"  (1.676 m)   Body mass index is 27.6 kg/m.   Physical Exam Vitals reviewed.  Constitutional:      Appearance: Normal appearance.  HENT:  Head: Normocephalic and atraumatic.  Eyes:     Conjunctiva/sclera: Conjunctivae normal.     Pupils: Pupils are equal, round, and reactive to light.  Cardiovascular:     Rate and Rhythm: Normal rate and regular rhythm.     Pulses: Normal pulses.     Heart sounds: Normal heart sounds. No murmur heard.  No friction rub. No gallop.   Pulmonary:     Effort: Pulmonary effort is normal. No respiratory distress.     Breath sounds: Normal breath sounds. No stridor. No wheezing or rales.  Abdominal:     General: Bowel sounds are normal.     Palpations: Abdomen is soft.      Tenderness: There is no abdominal tenderness.  Musculoskeletal:     Right lower leg: No edema.     Left lower leg: No edema.  Skin:    General: Skin is warm and dry.     Comments: Fluid filled cyst to left hand and left ankle  Neurological:     General: No focal deficit present.     Mental Status: He is alert and oriented to person, place, and time.  Psychiatric:        Mood and Affect: Mood normal.        Behavior: Behavior normal.     No results found for this or any previous visit (from the past 24 hour(s)).  No results found.   ASSESSMENT and PLAN  Problem List Items Addressed This Visit      Other   Ganglion cyst of finger Refused referral to ortho for managemnt Denies pain at this time.    Other Visit Diagnoses    Hyperthyroidism    -  Primary   Relevant Orders   Thyroid Profile Will follow up with lab results.   Encounter for vitamin deficiency screening       Relevant Orders   Vitamin D, 25-hydroxy   Prediabetes     Fully discussed A1C Will continue to monitor Encouraged to make lifestyle changes   Pure hypercholesterolemia       Relevant Medications   atorvastatin (LIPITOR) 10 MG tablet daily Will follow up Encouraged to modify diet and increase exercise   Encounter for hepatitis C screening test for low risk patient       Relevant Orders   HCV Ab w/Rflx to Verification   Annual physical exam       Relevant Orders   Hepatic Function Panel Previously elevated in 2008, no labs since   Drug-induced constipation   Provided order for docusate last visit Encouraged to increase fiber in diet        Refuses colonoscopy, cologuard or any other colon cancer screening options Refuses HIV screening Print out of labs given  Discussed chronic pain referral, seems to be moving thru the system and he should bet getting a call soon, encouraged to let the clinic know if he does not hear back.  Return in about 6 months (around 07/21/2020).    Macario Carls Taneisha Fuson,  FNP-BC [facility] [address] [place], [place] [address] Ph.  [phone] Fax [phone]

## 2020-01-22 LAB — HEPATIC FUNCTION PANEL
ALT: 17 IU/L (ref 0–44)
AST: 19 IU/L (ref 0–40)
Albumin: 4.4 g/dL (ref 3.8–4.8)
Alkaline Phosphatase: 52 IU/L (ref 44–121)
Bilirubin Total: 0.4 mg/dL (ref 0.0–1.2)
Bilirubin, Direct: 0.1 mg/dL (ref 0.00–0.40)
Total Protein: 7.2 g/dL (ref 6.0–8.5)

## 2020-01-22 LAB — THYROID PANEL
Free Thyroxine Index: 2.3 (ref 1.2–4.9)
T3 Uptake Ratio: 30 % (ref 24–39)
T4, Total: 7.8 ug/dL (ref 4.5–12.0)

## 2020-01-22 LAB — VITAMIN D 25 HYDROXY (VIT D DEFICIENCY, FRACTURES): Vit D, 25-Hydroxy: 21.8 ng/mL — ABNORMAL LOW (ref 30.0–100.0)

## 2020-01-22 LAB — HCV INTERPRETATION

## 2020-01-22 LAB — HCV AB W/RFLX TO VERIFICATION: HCV Ab: <0.1 s/co ratio (ref 0.0–0.9)

## 2020-01-22 NOTE — Progress Notes (Signed)
Hello If you could let Aaron Hill know his labs look good. His liver enzymes are back to normal and his thyroid labs look good. His vitamin D is low if he could take 1000 IU of over the counter vitamin D daily. Thanks!

## 2020-03-10 ENCOUNTER — Telehealth: Payer: Self-pay | Admitting: Family Medicine

## 2020-03-10 NOTE — Telephone Encounter (Signed)
Aaron Hill is calling because he had an referral , but when he called Dr.  Cherylann Ratel  (for back pain ) they said they did not have him in system     Can we resnd referral for patient   Any questions call  Pt with any questions [phone]

## 2020-03-11 NOTE — Telephone Encounter (Signed)
The pt is requesting this Dr. Cherylann Ratel with the recent referral for the back pain. Do I have resend the referral or if there is anything else we need to do on our end please let me know. Thanks

## 2020-03-31 ENCOUNTER — Ambulatory Visit
Admission: RE | Admit: 2020-03-31 | Discharge: 2020-03-31 | Disposition: A | Discharge status: Home / Self Care | Payer: Medicare HMO | Source: Ambulatory Visit | Attending: Student in an Organized Health Care Education/Training Program | Admitting: Student in an Organized Health Care Education/Training Program

## 2020-03-31 ENCOUNTER — Other Ambulatory Visit: Payer: Self-pay

## 2020-03-31 ENCOUNTER — Ambulatory Visit: Payer: Medicare HMO | Admitting: Student in an Organized Health Care Education/Training Program

## 2020-03-31 ENCOUNTER — Encounter: Payer: Self-pay | Admitting: Student in an Organized Health Care Education/Training Program

## 2020-03-31 VITALS — BP 123/87 | HR 72 | Temp 97.2°F | Resp 16 | Ht 66.0 in | Wt 175.0 lb

## 2020-03-31 DIAGNOSIS — M79645 Pain in left finger(s): Secondary | ICD-10-CM

## 2020-03-31 DIAGNOSIS — Z9889 Other specified postprocedural states: Secondary | ICD-10-CM | POA: Insufficient documentation

## 2020-03-31 DIAGNOSIS — Z79891 Long term (current) use of opiate analgesic: Secondary | ICD-10-CM | POA: Diagnosis not present

## 2020-03-31 DIAGNOSIS — M5442 Lumbago with sciatica, left side: Secondary | ICD-10-CM

## 2020-03-31 DIAGNOSIS — M79642 Pain in left hand: Secondary | ICD-10-CM

## 2020-03-31 DIAGNOSIS — G894 Chronic pain syndrome: Secondary | ICD-10-CM | POA: Insufficient documentation

## 2020-03-31 DIAGNOSIS — M5416 Radiculopathy, lumbar region: Secondary | ICD-10-CM | POA: Insufficient documentation

## 2020-03-31 DIAGNOSIS — M47816 Spondylosis without myelopathy or radiculopathy, lumbar region: Secondary | ICD-10-CM | POA: Diagnosis not present

## 2020-03-31 DIAGNOSIS — Z981 Arthrodesis status: Secondary | ICD-10-CM | POA: Diagnosis not present

## 2020-03-31 DIAGNOSIS — G8929 Other chronic pain: Secondary | ICD-10-CM

## 2020-03-31 MED ORDER — GABAPENTIN 300 MG PO CAPS: 300.0000 mg | ORAL_CAPSULE | Freq: Every day | ORAL | 2 refills | Status: AC

## 2020-03-31 NOTE — Progress Notes (Signed)
Safety precautions to be maintained throughout the outpatient stay will include: orient to surroundings, keep bed in low position, maintain call bell within reach at all times, provide assistance with transfer out of bed and ambulation.  

## 2020-03-31 NOTE — Progress Notes (Signed)
Patient: Aaron Hill  Service Category: E/M  Provider: Gillis Santa, MD  DOB: [birth year]  DOS: 03/31/2020  Referring Provider: Bari Edward, FNP  MRN: [id number]  Setting: Ambulatory outpatient  PCP: Just, Laurita Quint, FNP  Type: New Patient  Specialty: Interventional Pain Management    Location: Office  Delivery: Face-to-face     Primary Reason(s) for Visit: Encounter for initial evaluation of one or more chronic problems (new to examiner) potentially causing chronic pain, and posing a threat to normal musculoskeletal function. (Level of risk: High) CC: Back Pain (lower) and Hand Pain (left)  HPI  Mr. Aaron Hill is a 60 y.o. year old, male patient, who comes for the first time to our practice referred by Just, Laurita Quint, FNP for our initial evaluation of his chronic pain. He has Chronic left-sided low back pain with left-sided sciatica; Ganglion cyst of finger; Prediabetes; Hyperlipidemia; BMI 27.0-27.9,adult; Constipation; Chronic pain syndrome; Encounter for long-term opiate analgesic use; Left hand pain; Chronic radicular lumbar pain (left); and History of lumbar surgery on their problem list. Today he comes in for evaluation of his Back Pain (lower) and Hand Pain (left)  Pain Assessment: Location: Lower Back Radiating: left leg Onset: More than a month ago Duration: Chronic pain Quality: Sharp,Tiring Severity: 4 /10 (subjective, self-reported pain score)  Effect on ADL: difficulty performing daily activities Timing: Constant Modifying factors: medications BP: 123/87  HR: 72  Onset and Duration: Gradual and Present longer than 3 months Cause of pain: Work related accident or event Severity: Getting better, NAS-11 at its worse: 6/10, NAS-11 at its best: 4/10, NAS-11 now: 4/10 and NAS-11 on the average: 5/10 Timing: During activity or exercise Aggravating Factors: Bending, Climbing, Lifiting, Prolonged sitting, Walking uphill and Walking downhill Alleviating Factors: Medications Associated Problems:  Constipation, Day-time cramps, Dizziness, Fatigue, Inability to control bladder (urine), Numbness, Swelling, Tingling, Vomiting , Weakness, Pain that wakes patient up and Pain that does not allow patient to sleep Quality of Pain: Aching, Cramping, Tingling and Uncomfortable Previous Examinations or Tests: CT scan and X-rays Previous Treatments: Physical Therapy and Pool exercises  Patient is a pleasant 60 year old male, [place] speaking, who presents today with a chief complaint of low back pain with radiation to his left leg.  Prior history of lumbar spine surgery.  Was seeing his primary care provider who is prescribing him hydrocodone 5 mg twice daily for his chronic pain syndrome.  His primary care provider has retired and his current primary care provider is now willing to manage his hydrocodone even though he has been compliant with therapy and is low risk.  His last fill was on January 15, 2020.  He has been rationing his hydrocodone.  He states that the medications do help provide him with some pain relief and functional improvement. Has been on this for >5 years.  No side effects noted.  Denies having tried gabapentin or Lyrica in the past.  Denies having done an epidural steroid injection in the last 5 years.  Patient also complains of left hand pain, swelling along his left thumb.  No drainage noted.  Historic Controlled Substance Pharmacotherapy Review   01/15/2020  08/28/2019   1  Hydrocodone-Acetamin 5-325 Mg  60.00  30  [place]  [id number]  [place] ([id number])  0/0  10.00 MME  Medicare  [place]      Historical Monitoring: The patient  reports no history of drug use. List of all UDS Test(s): No results found for: MDMA, COCAINSCRNUR, [place], [place], CANNABQUANT, THCU,  ETH List of other Serum/Urine Drug Screening Test(s):  No results found for: AMPHSCRSER, BARBSCRSER, BENZOSCRSER, COCAINSCRSER, COCAINSCRNUR, PCPSCRSER, PCPQUANT, THCSCRSER, THCU, CANNABQUANT, OPIATESCRSER, OXYSCRSER,  PROPOXSCRSER, ETH Historical Background Evaluation: [place] PMP: PDMP reviewed during this encounter. Online review of the past 72-monthperiod conducted.             [place] Department of public safety, offender search: (Editor, commissioningInformation) Non-contributory Risk Assessment Profile: Aberrant behavior: None observed or detected today Risk factors for fatal opioid overdose: None identified today Fatal overdose hazard ratio (HR): Calculation deferred Non-fatal overdose hazard ratio (HR): Calculation deferred Risk of opioid abuse or dependence: 0.7-3.0% with doses ? 36 MME/day and 6.1-26% with doses ? 120 MME/day. Substance use disorder (SUD) risk level: See below Personal History of Substance Abuse (SUD-Substance use disorder):  Alcohol: Negative  Illegal Drugs: Negative  Rx Drugs: Negative  ORT Risk Level calculation: Low Risk  Opioid Risk Tool - 03/31/20 1426      Family History of Substance Abuse   Alcohol Negative    Illegal Drugs Negative    Rx Drugs Negative      Personal History of Substance Abuse   Alcohol Negative    Illegal Drugs Negative    Rx Drugs Negative      Age   Age between 149-45years  No      History of Preadolescent Sexual Abuse   History of Preadolescent Sexual Abuse Negative or Male      Psychological Disease   Psychological Disease Negative    Depression Negative      Total Score   Opioid Risk Tool Scoring 0    Opioid Risk Interpretation Low Risk          ORT Scoring interpretation table:  Score <3 = Low Risk for SUD  Score between 4-7 = Moderate Risk for SUD  Score >8 = High Risk for Opioid Abuse   PHQ-2 Depression Scale:  Total score: 0  PHQ-2 Scoring interpretation table: (Score and probability of major depressive disorder)  Score 0 = No depression  Score 1 = 15.4% Probability  Score 2 = 21.1% Probability  Score 3 = 38.4% Probability  Score 4 = 45.5% Probability  Score 5 = 56.4% Probability  Score 6 = 78.6% Probability   PHQ-9 Depression  Scale:  Total score: 0  PHQ-9 Scoring interpretation table:  Score 0-4 = No depression  Score 5-9 = Mild depression  Score 10-14 = Moderate depression  Score 15-19 = Moderately severe depression  Score 20-27 = Severe depression (2.4 times higher risk of SUD and 2.89 times higher risk of overuse)   Pharmacologic Plan: As per protocol, I have not taken over any controlled substance management, pending the results of ordered tests and/or consults.            Initial impression: Pending review of available data and ordered tests.  Meds   Current Outpatient Medications:  .  gabapentin (NEURONTIN) 300 MG capsule, Take 1 capsule (300 mg total) by mouth at bedtime., Disp: 30 capsule, Rfl: 2 .  HYDROcodone-acetaminophen (NORCO/VICODIN) 5-325 MG tablet, Take 1 tablet by mouth every 6 (six) hours as needed for moderate pain., Disp: , Rfl:  .  atorvastatin (LIPITOR) 10 MG tablet, Take 1 tablet (10 mg total) by mouth daily. (Patient not taking: Reported on 03/31/2020), Disp: 90 tablet, Rfl: 3 .  cyclobenzaprine (FLEXERIL) 10 MG tablet, Take 0.5-1 tablets (5-10 mg total) by mouth 3 (three) times daily as needed. (Patient not taking: No sig  reported), Disp: 30 tablet, Rfl: 0 .  docusate sodium (COLACE) 100 MG capsule, Take 1 capsule (100 mg total) by mouth daily. (Patient not taking: No sig reported), Disp: 90 capsule, Rfl: 3  Imaging Review  CT Lumbar Spine W Contrast  Narrative Clinical Data:   Back  pain.  Right leg pain and left foot pain. LUMBAR DISKOGRAM: The patient is [place], and does not speak [place].  A [place] translator was present, who served as an effective bridge between myself and the patient.  The procedure was explained through the interpreter, and the patient voiced understanding.  All questions were  encouraged and answered. Technique:  The patient was given  informed consent including the risk of pain, infection, spinal fluid leak, and neurologic deficit.  Specifically, the  risk of infection including diskitis and osteomyelitis was discussed with the patient, who agreed to proceed. The patient was given 1 gram Ancef IV thirty minutes prior to the procedure.  1 cc of Ancef was added to 20 cc of Omnipaque 180 used for injection. The back was prepared with a sterile scrub sponge for five minutes followed by copious application of Betadine solution.  Sterile drapes were applied and strict sterile technique was used by everyone in the room.  A left paraspinous approach was taken using 22 gauge Chiba needles. Findings: Individual disk spaces were examined as follows: L3-4:  Opening pressure 40 PSI.  Pressure at end point 100 PSI.  Quantity of contrast 2 ml.  There was no response.  The disk was morphologically normal. L4-5:  Opening pressure 40 PSI.  Pressure at pain response/end point 80 PSI.  Quantity of contrast 2.5 ml.  There was no response. The disk was morphologically normal. L5-S1:  Opening pressure 40 PSI.  Pressure at end point 80 PSI.  Quantity of contrast 2 ml.  The patient experienced some left and right leg pain.  He rated this a 5 on a scale of 1 to 10.  This was similar to his pain at home.  There was some posterior subligamentous extension of contrast. IMPRESSION: Abnormal lumbar diskography at L5-S1. POST DISKOGRAM CT SCAN: Technique:  Multidetector CT imaging of the lumbar spine was performed after intradiskal injection of contrast.  Multiplanar CT image reconstructions were also generated. Findings: L3-4:  Normal interspace. L4-5:  Normal interspace. L5-S1:  Bilateral facet arthropathy, right greater than left.  Shallow subligamentous protrusion central and to the right.  This potentially irritates the right S1 nerve root. IMPRESSION: 1.  Abnormal lumbar diskography at L5-S1 suggesting a subligamentous protrusion, and reproduction of at least part of the patient's home pain. 2.  Normal lumbar diskography at L3-4 and L4-5. 3.  There is good general  agreement with the prior MRI from [facility] dated 07/24/06 which shows normal disk spaces at L3-4 and L4-5, and a shallow protrusion at L5-S1.       Provider: Coralee Pesa  Narrative Clinical Data: L5-S1 laminectomy and fusion. LUMBAR SPINE - 3 VIEWS: Film # 1 at 1430 hours demonstrates instruments overlying L5-S1 disc space from a posterior approach. Film # 2 at 1530 hours demonstrates pedicle screw-and-rod construct with an interbody spacer at L5-S1. Film # 3 at 1550 hours demonstrates pedicle screw-and-rod construct grossly intact with interbody spacer at L5-S1. Recommend formal lumbar spine series with stable. IMPRESSION:   As discussed above.  Provider: Kathreen Cosier, Lizabeth Leyden   Narrative Clinical Data:   Back  pain.  Right leg pain and left foot pain. LUMBAR DISKOGRAM: The  patient is [place], and does not speak [place].  A [place] translator was present, who served as an effective bridge between myself and the patient.  The procedure was explained through the interpreter, and the patient voiced understanding.  All questions were  encouraged and answered. Technique:  The patient was given  informed consent including the risk of pain, infection, spinal fluid leak, and neurologic deficit.  Specifically, the risk of infection including diskitis and osteomyelitis was discussed with the patient, who agreed to proceed. The patient was given 1 gram Ancef IV thirty minutes prior to the procedure.  1 cc of Ancef was added to 20 cc of Omnipaque 180 used for injection. The back was prepared with a sterile scrub sponge for five minutes followed by copious application of Betadine solution.  Sterile drapes were applied and strict sterile technique was used by everyone in the room.  A left paraspinous approach was taken using 22 gauge Chiba needles. Findings: Individual disk spaces were examined as follows: L3-4:  Opening pressure 40 PSI.  Pressure at end point 100 PSI.   Quantity of contrast 2 ml.  There was no response.  The disk was morphologically normal. L4-5:  Opening pressure 40 PSI.  Pressure at pain response/end point 80 PSI.  Quantity of contrast 2.5 ml.  There was no response. The disk was morphologically normal. L5-S1:  Opening pressure 40 PSI.  Pressure at end point 80 PSI.  Quantity of contrast 2 ml.  The patient experienced some left and right leg pain.  He rated this a 5 on a scale of 1 to 10.  This was similar to his pain at home.  There was some posterior subligamentous extension of contrast. IMPRESSION: Abnormal lumbar diskography at L5-S1. POST DISKOGRAM CT SCAN: Technique:  Multidetector CT imaging of the lumbar spine was performed after intradiskal injection of contrast.  Multiplanar CT image reconstructions were also generated. Findings: L3-4:  Normal interspace. L4-5:  Normal interspace. L5-S1:  Bilateral facet arthropathy, right greater than left.  Shallow subligamentous protrusion central and to the right.  This potentially irritates the right S1 nerve root. IMPRESSION: 1.  Abnormal lumbar diskography at L5-S1 suggesting a subligamentous protrusion, and reproduction of at least part of the patient's home pain. 2.  Normal lumbar diskography at L3-4 and L4-5. 3.  There is good general agreement with the prior MRI from [facility] dated 07/24/06 which shows normal disk spaces at L3-4 and L4-5, and a shallow protrusion at L5-S1.       Provider: Candis Schatz, Ceasar Lund  Complexity Note: Imaging results reviewed. Results shared with Mr. Demirjian, using Layman's terms.                         ROS  Cardiovascular: High blood pressure Pulmonary or Respiratory: No reported pulmonary signs or symptoms such as wheezing and difficulty taking a deep full breath (Asthma), difficulty blowing air out (Emphysema), coughing up mucus (Bronchitis), persistent dry cough, or temporary stoppage of breathing during sleep Neurological: No reported  neurological signs or symptoms such as seizures, abnormal skin sensations, urinary and/or fecal incontinence, being born with an abnormal open spine and/or a tethered spinal cord Psychological-Psychiatric: No reported psychological or psychiatric signs or symptoms such as difficulty sleeping, anxiety, depression, delusions or hallucinations (schizophrenial), mood swings (bipolar disorders) or suicidal ideations or attempts Gastrointestinal: No reported gastrointestinal signs or symptoms such as vomiting or evacuating blood, reflux, heartburn, alternating episodes of diarrhea and constipation, inflamed or  scarred liver, or pancreas or irrregular and/or infrequent bowel movements Genitourinary: No reported renal or genitourinary signs or symptoms such as difficulty voiding or producing urine, peeing blood, non-functioning kidney, kidney stones, difficulty emptying the bladder, difficulty controlling the flow of urine, or chronic kidney disease Hematological: No reported hematological signs or symptoms such as prolonged bleeding, low or poor functioning platelets, bruising or bleeding easily, hereditary bleeding problems, low energy levels due to low hemoglobin or being anemic Endocrine: High blood sugar controlled without the use of insulin (NIDDM) Rheumatologic: No reported rheumatological signs and symptoms such as fatigue, joint pain, tenderness, swelling, redness, heat, stiffness, decreased range of motion, with or without associated rash Musculoskeletal: Negative for myasthenia gravis, muscular dystrophy, multiple sclerosis or malignant hyperthermia Work History: Quit going to work on his/her own  Allergies  Mr. Cansler has No Known Allergies.  Laboratory Chemistry Profile   Renal Lab Results  Component Value Date   BUN 20 01/15/2020   CREATININE 1.00 01/15/2020   BCR 20 01/15/2020   GFRAA 93 01/15/2020   GFRNONAA 81 01/15/2020   PROTEINUR NEGATIVE 10/24/2006     Electrolytes Lab Results   Component Value Date   NA 137 01/15/2020   K 4.6 01/15/2020   CL 102 01/15/2020   CALCIUM 9.6 01/15/2020     Hepatic Lab Results  Component Value Date   AST 19 01/21/2020   ALT 17 01/21/2020   ALBUMIN 4.4 01/21/2020   ALKPHOS 52 01/21/2020     ID No results found for: LYMEIGGIGMAB, HIV, SARSCOV2NAA, STAPHAUREUS, MRSAPCR, HCVAB, PREGTESTUR, RMSFIGG, QFVRPH1IGG, QFVRPH2IGG, LYMEIGGIGMAB   Bone Lab Results  Component Value Date   VD25OH 21.8 (L) 01/21/2020     Endocrine Lab Results  Component Value Date   GLUCOSE 111 (H) 01/15/2020   GLUCOSEU NEGATIVE 10/24/2006   HGBA1C 5.8 (H) 01/15/2020   TSH 0.236 (L) 01/15/2020     Neuropathy Lab Results  Component Value Date   HGBA1C 5.8 (H) 01/15/2020     CNS No results found for: COLORCSF, APPEARCSF, RBCCOUNTCSF, WBCCSF, POLYSCSF, LYMPHSCSF, EOSCSF, PROTEINCSF, GLUCCSF, JCVIRUS, CSFOLI, IGGCSF, LABACHR, ACETBL, LABACHR, ACETBL   Inflammation (CRP: Acute  ESR: Chronic) No results found for: CRP, ESRSEDRATE, LATICACIDVEN   Rheumatology Lab Results  Component Value Date   LABURIC 7.4 01/15/2020     Coagulation Lab Results  Component Value Date   INR 0.9 10/24/2006   LABPROT 12.6 10/24/2006   APTT 33 10/24/2006   PLT 190 01/15/2020     Cardiovascular Lab Results  Component Value Date   HGB 15.3 01/15/2020   HCT 45.2 01/15/2020     Screening No results found for: SARSCOV2NAA, COVIDSOURCE, STAPHAUREUS, MRSAPCR, HCVAB, HIV, PREGTESTUR   Cancer No results found for: CEA, CA125, LABCA2   Allergens No results found for: ALMOND, APPLE, ASPARAGUS, AVOCADO, BANANA, BARLEY, BASIL, BAYLEAF, GREENBEAN, LIMABEAN, WHITEBEAN, BEEFIGE, REDBEET, BLUEBERRY, BROCCOLI, CABBAGE, MELON, CARROT, CASEIN, CASHEWNUT, CAULIFLOWER, CELERY     Note: Lab results reviewed.  PFSH  Drug: Mr. Alderman  reports no history of drug use. Alcohol:  reports no history of alcohol use. Tobacco:  reports that he has never smoked. He has never used  smokeless tobacco. Medical:  has a past medical history of Diabetes mellitus without complication ([place]). Family: family history is not on file.  Past Surgical History:  Procedure Laterality Date  . SPINE SURGERY     Active Ambulatory Problems    Diagnosis Date Noted  . Chronic left-sided low back pain with left-sided sciatica 06/05/2013  .  Ganglion cyst of finger 08/28/2019  . Prediabetes 01/21/2020  . Hyperlipidemia 01/21/2020  . BMI 27.0-27.9,adult 01/21/2020  . Constipation 01/21/2020  . Chronic pain syndrome 03/31/2020  . Encounter for long-term opiate analgesic use 03/31/2020  . Left hand pain 03/31/2020  . Chronic radicular lumbar pain (left) 03/31/2020  . History of lumbar surgery 03/31/2020   Resolved Ambulatory Problems    Diagnosis Date Noted  . No Resolved Ambulatory Problems   Past Medical History:  Diagnosis Date  . Diabetes mellitus without complication ([place])    Constitutional Exam  General appearance: Well nourished, well developed, and well hydrated. In no apparent acute distress Vitals:   03/31/20 1419  BP: 123/87  Pulse: 72  Resp: 16  Temp: (!) 97.2 F (36.2 C)  TempSrc: Temporal  SpO2: 100%  Weight: 175 lb (79.4 kg)  Height: 5' 6"  (1.676 m)   BMI Assessment: Estimated body mass index is 28.25 kg/m as calculated from the following:   Height as of this encounter: 5' 6"  (1.676 m).   Weight as of this encounter: 175 lb (79.4 kg).  BMI interpretation table: BMI level Category Range association with higher incidence of chronic pain  <18 kg/m2 Underweight   18.5-24.9 kg/m2 Ideal body weight   25-29.9 kg/m2 Overweight Increased incidence by 20%  30-34.9 kg/m2 Obese (Class I) Increased incidence by 68%  35-39.9 kg/m2 Severe obesity (Class II) Increased incidence by 136%  >40 kg/m2 Extreme obesity (Class III) Increased incidence by 254%   Patient's current BMI Ideal Body weight  Body mass index is 28.25 kg/m. Ideal body weight: 63.8 kg (140 lb 10.5  oz) Adjusted ideal body weight: 70 kg (154 lb 6.3 oz)   BMI Readings from Last 4 Encounters:  03/31/20 28.25 kg/m  01/21/20 27.60 kg/m  01/15/20 27.12 kg/m  12/28/16 28.31 kg/m   Wt Readings from Last 4 Encounters:  03/31/20 175 lb (79.4 kg)  01/21/20 171 lb (77.6 kg)  01/15/20 168 lb (76.2 kg)  12/28/16 175 lb 6.4 oz (79.6 kg)    Psych/Mental status: Alert, oriented x 3 (person, place, & time)       Eyes: PERLA Respiratory: No evidence of acute respiratory distress  Cervical Spine Exam  Skin & Axial Inspection: No masses, redness, edema, swelling, or associated skin lesions Alignment: Symmetrical Functional ROM: Unrestricted ROM      Stability: No instability detected Muscle Tone/Strength: Functionally intact. No obvious neuro-muscular anomalies detected. Sensory (Neurological): Unimpaired Palpation: No palpable anomalies              Upper Extremity (UE) Exam    Side: Right upper extremity  Side: Left upper extremity  Skin & Extremity Inspection: Skin color, temperature, and hair growth are WNL. No peripheral edema or cyanosis. No masses, redness, swelling, asymmetry, or associated skin lesions. No contractures.  Skin & Extremity Inspection: Left mass on dorsal aspect of thumb.  No drainage noted.  Functional ROM: Unrestricted ROM          Functional ROM: Unrestricted ROM          Muscle Tone/Strength: Functionally intact. No obvious neuro-muscular anomalies detected.   Muscle Tone/Strength: Functionally intact. No obvious neuro-muscular anomalies detected.  Sensory (Neurological): Unimpaired          Sensory (Neurological): Unimpaired          Palpation: No palpable anomalies              Palpation: No palpable anomalies  Provocative Test(s):  Phalen's test: deferred Tinel's test: deferred Apley's scratch test (touch opposite shoulder):  Action 1 (Across chest): deferred Action 2 (Overhead): deferred Action 3 (LB reach): deferred   Provocative Test(s):   Phalen's test: deferred Tinel's test: deferred Apley's scratch test (touch opposite shoulder):  Action 1 (Across chest): deferred Action 2 (Overhead): deferred Action 3 (LB reach): deferred     Lumbar Exam  Skin & Axial Inspection: Well healed scar from previous spine surgery detected Alignment: Symmetrical Functional ROM: Pain restricted ROM affecting both sides Stability: No instability detected Muscle Tone/Strength: Functionally intact. No obvious neuro-muscular anomalies detected. Sensory (Neurological): Dermatomal pain pattern left L5-S1 Palpation: No palpable anomalies       Provocative Tests: Hyperextension/rotation test: deferred today       Lumbar quadrant test (Kemp's test): (+) on the left for foraminal stenosis Lateral bending test: deferred today       Patrick's Maneuver: deferred today                   FABER* test: deferred today                   S-I anterior distraction/compression test: deferred today         S-I lateral compression test: deferred today         S-I Thigh-thrust test: deferred today         S-I Gaenslen's test: deferred today         *(Flexion, ABduction and External Rotation)  Gait & Posture Assessment  Ambulation: Unassisted Gait: Relatively normal for age and body habitus Posture: WNL   Lower Extremity Exam    Side: Right lower extremity  Side: Left lower extremity  Stability: No instability observed          Stability: No instability observed          Skin & Extremity Inspection: Skin color, temperature, and hair growth are WNL. No peripheral edema or cyanosis. No masses, redness, swelling, asymmetry, or associated skin lesions. No contractures.  Skin & Extremity Inspection: Skin color, temperature, and hair growth are WNL. No peripheral edema or cyanosis. No masses, redness, swelling, asymmetry, or associated skin lesions. No contractures.  Functional ROM: Unrestricted ROM                  Functional ROM: Pain restricted ROM for all  joints of the lower extremity Limited SLR (straight leg raise)  Muscle Tone/Strength: Functionally intact. No obvious neuro-muscular anomalies detected.  Muscle Tone/Strength: Functionally intact. No obvious neuro-muscular anomalies detected.  Sensory (Neurological): Unimpaired        Sensory (Neurological): Neurogenic pain pattern        DTR: Patellar: deferred today Achilles: deferred today Plantar: deferred today  DTR: Patellar: deferred today Achilles: deferred today Plantar: deferred today  Palpation: No palpable anomalies  Palpation: No palpable anomalies   Assessment  Primary Diagnosis & Pertinent Problem List: The primary encounter diagnosis was History of lumbar surgery. Diagnoses of Pain of left thumb, Chronic radicular lumbar pain (left), Left hand pain, Chronic left-sided low back pain with left-sided sciatica, Encounter for long-term opiate analgesic use, and Chronic pain syndrome were also pertinent to this visit.  Visit Diagnosis (New problems to examiner): 1. History of lumbar surgery   2. Pain of left thumb   3. Chronic radicular lumbar pain (left)   4. Left hand pain   5. Chronic left-sided low back pain with left-sided sciatica   6. Encounter for long-term  opiate analgesic use   7. Chronic pain syndrome    Plan of Care (Initial workup plan)  Note: Mr. Heldt was reminded that as per protocol, today's visit has been an evaluation only. We have not taken over the patient's controlled substance management.    Lab Orders     Compliance Drug Analysis, Ur  Imaging Orders     DG Lumbar Spine Complete W/Bend     DG Hand Complete Left Pharmacotherapy (current): Medications ordered:  Meds ordered this encounter  Medications  . gabapentin (NEURONTIN) 300 MG capsule    Sig: Take 1 capsule (300 mg total) by mouth at bedtime.    Dispense:  30 capsule    Refill:  2   Medications administered during this visit: Riddik VMarin Comment had no medications administered during this  visit.   Pharmacological management options:  Opioid Analgesics: The patient was informed that there is no guarantee that he would be a candidate for opioid analgesics. The decision will be made following CDC guidelines. This decision will be based on the results of diagnostic studies, as well as Mr. Mendenhall risk profile.  Hydrocodone 5 mg twice daily as needed, patient has been on this for over 5 years and has been compliant with therapy however due to PCP retiring, patient has not been able to find a prescribing physician.  Membrane stabilizer: Start gabapentin as above.  Future considerations could include Lyrica.  Muscle relaxant: To be determined at a later time  NSAID: To be determined at a later time  Other analgesic(s): To be determined at a later time   Interventional management options: Mr. Ishaq was informed that there is no guarantee that he would be a candidate for interventional therapies. The decision will be based on the results of diagnostic studies, as well as Mr. Seyler risk profile.  Procedure(s) under consideration:  Lumbar epidural steroid injection   Provider-requested follow-up: Return in about 2 weeks (around 04/14/2020) for Medication Management, in person.  Future Appointments  Date Time Provider [place]  04/13/2020  2:00 PM Gillis Santa, MD [facility] None    Note by: Gillis Santa, MD Date: 03/31/2020; Time: 3:16 PM

## 2020-04-09 LAB — COMPLIANCE DRUG ANALYSIS, UR

## 2020-04-12 ENCOUNTER — Telehealth: Payer: Self-pay

## 2020-04-12 NOTE — Telephone Encounter (Signed)
Called and left message for patient that the appointment on Monday has been changed to virtual.

## 2020-04-13 ENCOUNTER — Telehealth: Payer: Self-pay

## 2020-04-13 ENCOUNTER — Other Ambulatory Visit: Payer: Self-pay

## 2020-04-13 ENCOUNTER — Ambulatory Visit
Payer: Medicare HMO | Attending: Student in an Organized Health Care Education/Training Program | Admitting: Student in an Organized Health Care Education/Training Program

## 2020-04-13 NOTE — Telephone Encounter (Signed)
Attempted to call patient to go over pre virtual appointment questions.  No answer.

## 2020-04-13 NOTE — Progress Notes (Signed)
I attempted to call the patient however no response. Patient needs F2F appointment for 2nd visit and interpretor present. -Dr Cherylann Ratel

## 2020-04-14 ENCOUNTER — Other Ambulatory Visit: Payer: Self-pay

## 2021-01-12 DIAGNOSIS — R030 Elevated blood-pressure reading, without diagnosis of hypertension: Secondary | ICD-10-CM | POA: Diagnosis not present

## 2021-01-12 DIAGNOSIS — M545 Low back pain, unspecified: Secondary | ICD-10-CM | POA: Diagnosis not present

## 2021-01-12 DIAGNOSIS — I739 Peripheral vascular disease, unspecified: Secondary | ICD-10-CM | POA: Diagnosis not present

## 2021-01-12 DIAGNOSIS — K219 Gastro-esophageal reflux disease without esophagitis: Secondary | ICD-10-CM | POA: Diagnosis not present

## 2021-01-12 DIAGNOSIS — N529 Male erectile dysfunction, unspecified: Secondary | ICD-10-CM | POA: Diagnosis not present

## 2021-01-12 DIAGNOSIS — Z008 Encounter for other general examination: Secondary | ICD-10-CM | POA: Diagnosis not present

## 2021-01-12 DIAGNOSIS — E785 Hyperlipidemia, unspecified: Secondary | ICD-10-CM | POA: Diagnosis not present

## 2021-01-12 DIAGNOSIS — Z87891 Personal history of nicotine dependence: Secondary | ICD-10-CM | POA: Diagnosis not present

## 2021-01-12 DIAGNOSIS — G8929 Other chronic pain: Secondary | ICD-10-CM | POA: Diagnosis not present

## 2021-01-12 DIAGNOSIS — Z791 Long term (current) use of non-steroidal anti-inflammatories (NSAID): Secondary | ICD-10-CM | POA: Diagnosis not present

## 2021-01-21 DIAGNOSIS — Z5320 Procedure and treatment not carried out because of patient's decision for unspecified reasons: Secondary | ICD-10-CM | POA: Diagnosis not present

## 2021-01-21 DIAGNOSIS — M5416 Radiculopathy, lumbar region: Secondary | ICD-10-CM | POA: Diagnosis not present

## 2021-01-21 DIAGNOSIS — Z9889 Other specified postprocedural states: Secondary | ICD-10-CM | POA: Diagnosis not present

## 2021-01-21 DIAGNOSIS — G894 Chronic pain syndrome: Secondary | ICD-10-CM | POA: Diagnosis not present

## 2021-01-21 DIAGNOSIS — G8929 Other chronic pain: Secondary | ICD-10-CM | POA: Diagnosis not present

## 2021-02-07 IMAGING — CR DG LUMBAR SPINE COMPLETE W/ BEND
7 series · 7 of 7 positions shown · non-contrast
Comparison: October 26, 2006

CLINICAL DATA: Back pain.

EXAM:
LUMBAR SPINE - COMPLETE WITH BENDING VIEWS

[l-spine ap]
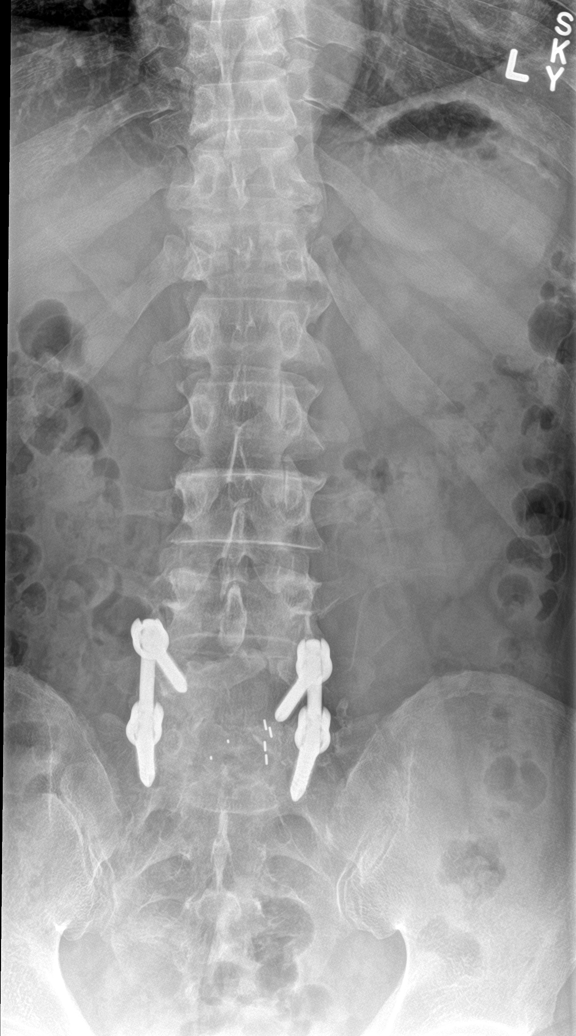

[l-spine obl (1 of 2)]
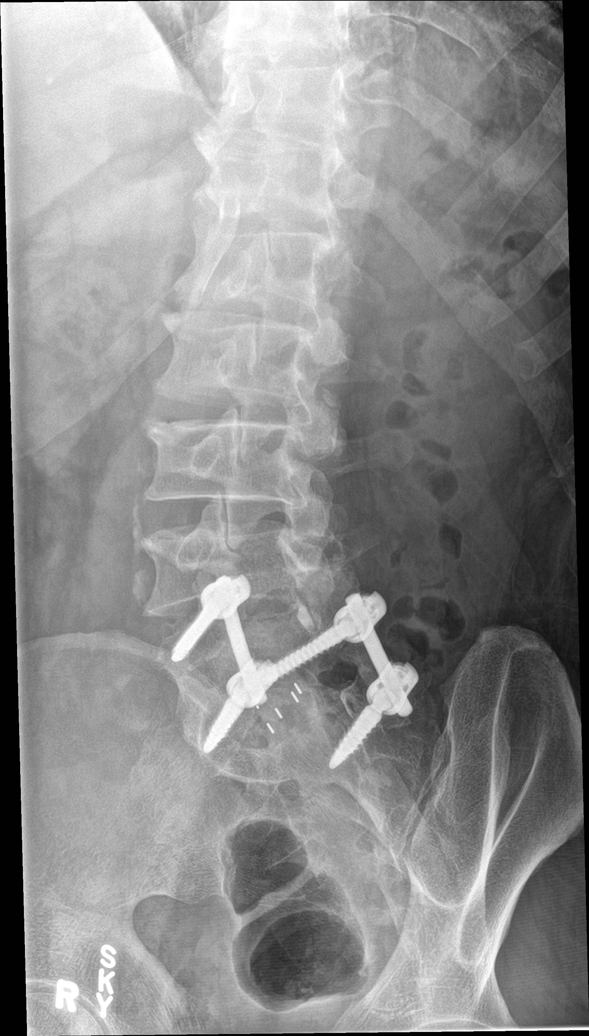

[l-spine obl (2 of 2)]
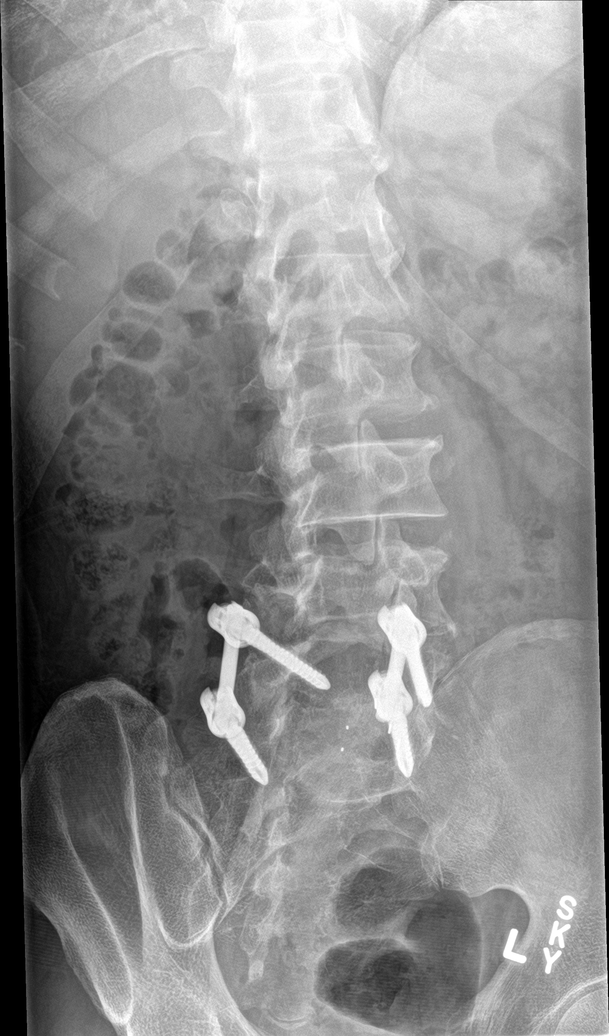

[l-spine lat]
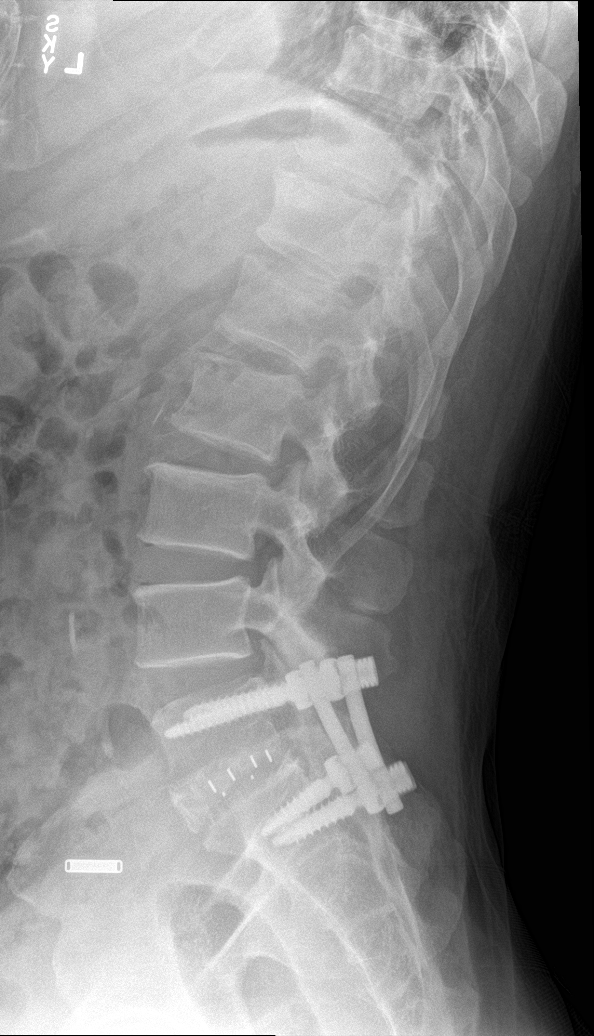

[l-spine flex]
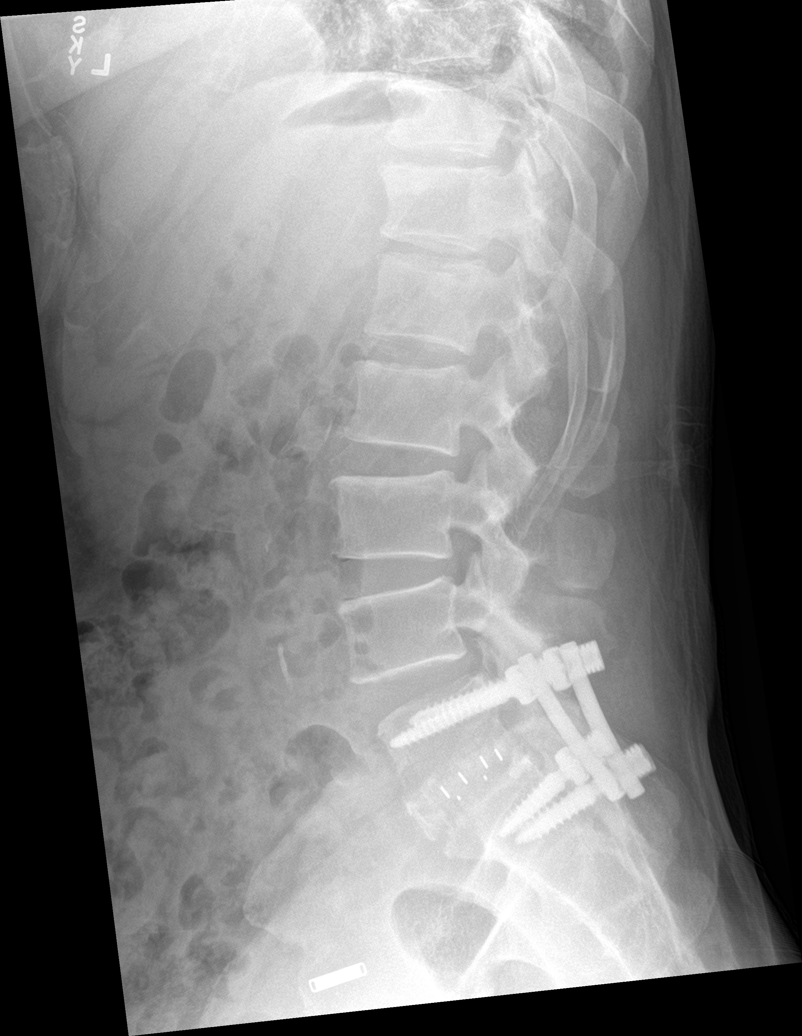

[l-spine ext]
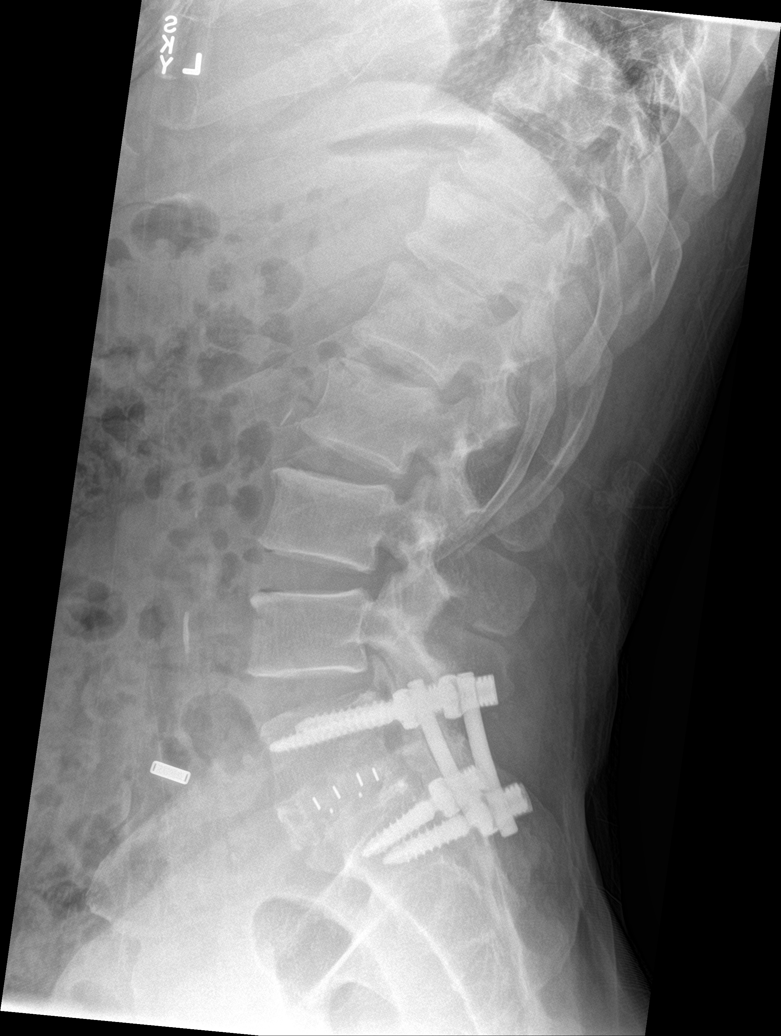

[l-spine spot]
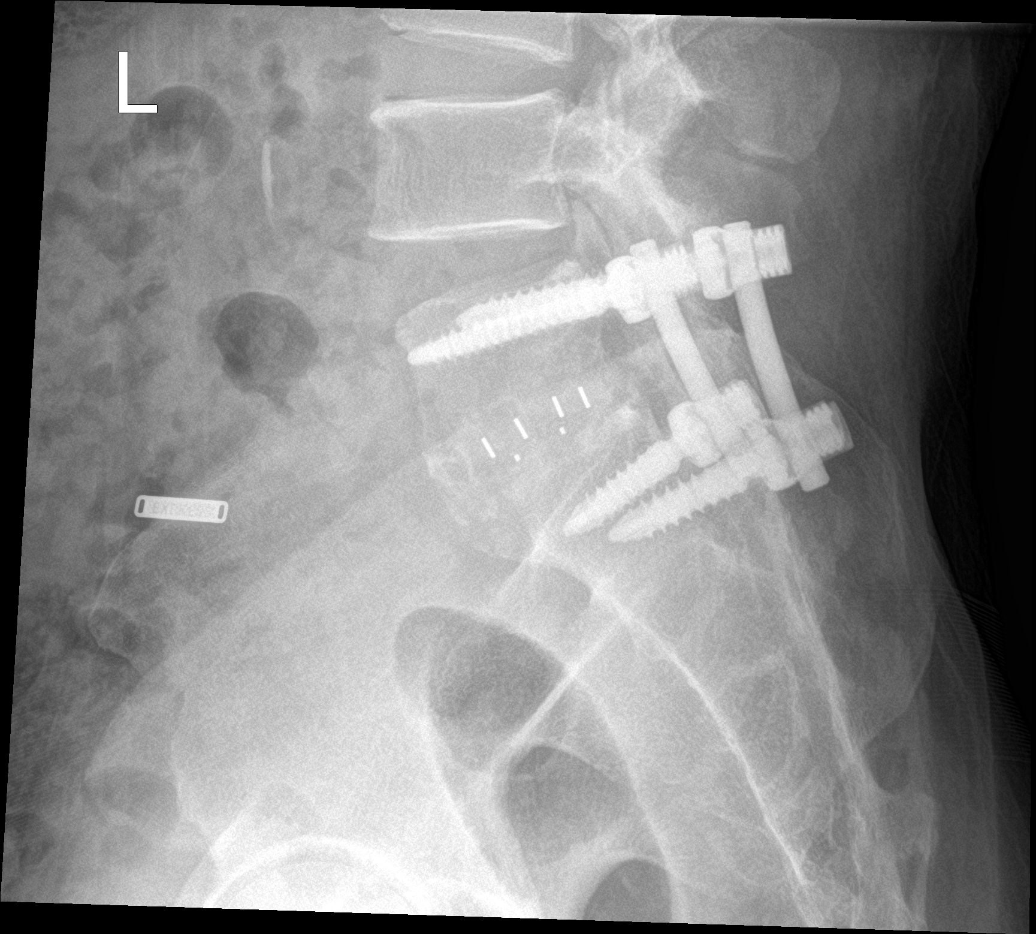

[7 of 7 positions shown; findings below may reference images not displayed]

FINDINGS: The patient has undergone remote posterior fusion from L5-S1. There
is an interbody spacer at the L5-S1 level. Mild multilevel
degenerative changes are noted throughout the thoracolumbar spine.
There is no acute compression fracture. There is some facet
arthrosis throughout the lumbar segments. There is no evidence for
significant dynamic listhesis.
IMPRESSION: 1. No acute displaced fracture or dislocation.
2. Degenerative changes as above.
3. No evidence for dynamic listhesis.

## 2021-02-23 DIAGNOSIS — Z1211 Encounter for screening for malignant neoplasm of colon: Secondary | ICD-10-CM | POA: Diagnosis not present

## 2021-02-23 DIAGNOSIS — Z0000 Encounter for general adult medical examination without abnormal findings: Secondary | ICD-10-CM | POA: Diagnosis not present

## 2021-02-23 DIAGNOSIS — M5416 Radiculopathy, lumbar region: Secondary | ICD-10-CM | POA: Diagnosis not present

## 2021-02-23 DIAGNOSIS — G8929 Other chronic pain: Secondary | ICD-10-CM | POA: Diagnosis not present

## 2021-02-23 DIAGNOSIS — R7303 Prediabetes: Secondary | ICD-10-CM | POA: Diagnosis not present

## 2021-02-23 DIAGNOSIS — E663 Overweight: Secondary | ICD-10-CM | POA: Diagnosis not present

## 2021-02-23 DIAGNOSIS — Z125 Encounter for screening for malignant neoplasm of prostate: Secondary | ICD-10-CM | POA: Diagnosis not present
